# Patient Record
Sex: Female | Born: 1990 | Race: Black or African American | Hispanic: No | Marital: Married | Smoking: Never smoker
Health system: Southern US, Community
[De-identification: ages and names within clinical notes are randomized; demographics above are authoritative.]

## PROBLEM LIST (undated history)

## (undated) DIAGNOSIS — T7840XA Allergy, unspecified, initial encounter: Secondary | ICD-10-CM

## (undated) DIAGNOSIS — J45909 Unspecified asthma, uncomplicated: Secondary | ICD-10-CM

## (undated) DIAGNOSIS — G43909 Migraine, unspecified, not intractable, without status migrainosus: Secondary | ICD-10-CM

## (undated) DIAGNOSIS — B9689 Other specified bacterial agents as the cause of diseases classified elsewhere: Secondary | ICD-10-CM

## (undated) DIAGNOSIS — N200 Calculus of kidney: Secondary | ICD-10-CM

## (undated) DIAGNOSIS — B379 Candidiasis, unspecified: Secondary | ICD-10-CM

## (undated) DIAGNOSIS — Z3490 Encounter for supervision of normal pregnancy, unspecified, unspecified trimester: Secondary | ICD-10-CM

## (undated) DIAGNOSIS — N63 Unspecified lump in unspecified breast: Secondary | ICD-10-CM

## (undated) DIAGNOSIS — N760 Acute vaginitis: Secondary | ICD-10-CM

## (undated) HISTORY — PX: EYE SURGERY: SHX253

## (undated) HISTORY — DX: Calculus of kidney: N20.0

## (undated) HISTORY — DX: Unspecified lump in unspecified breast: N63.0

## (undated) HISTORY — PX: WISDOM TOOTH EXTRACTION: SHX21

## (undated) HISTORY — DX: Allergy, unspecified, initial encounter: T78.40XA

## (undated) HISTORY — DX: Unspecified asthma, uncomplicated: J45.909

## (undated) HISTORY — DX: Candidiasis, unspecified: B37.9

## (undated) HISTORY — DX: Migraine, unspecified, not intractable, without status migrainosus: G43.909

## (undated) HISTORY — DX: Acute vaginitis: N76.0

## (undated) HISTORY — DX: Other specified bacterial agents as the cause of diseases classified elsewhere: B96.89

---

## 2010-02-12 ENCOUNTER — Emergency Department (HOSPITAL_COMMUNITY): Admission: EM | Admit: 2010-02-12 | Discharge: 2010-02-12 | Payer: Self-pay | Admitting: Emergency Medicine

## 2010-06-27 ENCOUNTER — Encounter: Admission: RE | Admit: 2010-06-27 | Discharge: 2010-06-27 | Payer: Self-pay | Admitting: Obstetrics and Gynecology

## 2010-07-31 ENCOUNTER — Emergency Department (HOSPITAL_COMMUNITY)
Admission: EM | Admit: 2010-07-31 | Discharge: 2010-07-31 | Payer: Self-pay | Source: Home / Self Care | Admitting: Emergency Medicine

## 2011-03-23 ENCOUNTER — Emergency Department (HOSPITAL_COMMUNITY): Payer: Medicaid Other

## 2011-03-23 ENCOUNTER — Emergency Department (HOSPITAL_COMMUNITY)
Admission: EM | Admit: 2011-03-23 | Discharge: 2011-03-23 | Disposition: A | Discharge status: Home / Self Care | Payer: Medicaid Other | Attending: Emergency Medicine | Admitting: Emergency Medicine

## 2011-03-23 ENCOUNTER — Encounter (HOSPITAL_COMMUNITY): Payer: Self-pay | Admitting: Radiology

## 2011-03-23 DIAGNOSIS — R059 Cough, unspecified: Secondary | ICD-10-CM | POA: Insufficient documentation

## 2011-03-23 DIAGNOSIS — J3489 Other specified disorders of nose and nasal sinuses: Secondary | ICD-10-CM | POA: Insufficient documentation

## 2011-03-23 DIAGNOSIS — R05 Cough: Secondary | ICD-10-CM | POA: Insufficient documentation

## 2011-03-23 DIAGNOSIS — J189 Pneumonia, unspecified organism: Secondary | ICD-10-CM | POA: Insufficient documentation

## 2011-03-23 LAB — DIFFERENTIAL
Basophils Absolute: 0 10*3/uL (ref 0.0–0.1)
Basophils Relative: 0 % (ref 0–1)
Lymphocytes Relative: 14 % (ref 12–46)
Monocytes Absolute: 0.6 10*3/uL (ref 0.1–1.0)
Monocytes Relative: 6 % (ref 3–12)
Neutro Abs: 9 10*3/uL — ABNORMAL HIGH (ref 1.7–7.7)
Neutrophils Relative %: 79 % — ABNORMAL HIGH (ref 43–77)

## 2011-03-23 LAB — CK TOTAL AND CKMB (NOT AT ARMC)
CK, MB: 1.2 ng/mL (ref 0.3–4.0)
Relative Index: INVALID (ref 0.0–2.5)

## 2011-03-23 LAB — POCT I-STAT, CHEM 8
BUN: 9 mg/dL (ref 6–23)
Creatinine, Ser: 0.7 mg/dL (ref 0.50–1.10)
Glucose, Bld: 91 mg/dL (ref 70–99)
Hemoglobin: 13.9 g/dL (ref 12.0–15.0)
Potassium: 4.3 mEq/L (ref 3.5–5.1)
Sodium: 138 mEq/L (ref 135–145)

## 2011-03-23 LAB — CBC
HCT: 39.4 % (ref 36.0–46.0)
Hemoglobin: 13.5 g/dL (ref 12.0–15.0)
MCHC: 34.3 g/dL (ref 30.0–36.0)
RBC: 4.4 MIL/uL (ref 3.87–5.11)

## 2011-03-23 LAB — TROPONIN I: Troponin I: <0.3 ng/mL (ref <0.30)

## 2011-03-23 MED ORDER — IOHEXOL 300 MG/ML  SOLN
70.0000 mL | Freq: Once | INTRAMUSCULAR | Status: AC | PRN
  Administered 2011-03-23: 70 mL via INTRAVENOUS

## 2011-07-30 ENCOUNTER — Encounter (HOSPITAL_COMMUNITY): Payer: Self-pay | Admitting: *Deleted

## 2011-07-30 ENCOUNTER — Emergency Department (HOSPITAL_COMMUNITY)
Admission: EM | Admit: 2011-07-30 | Discharge: 2011-07-31 | Disposition: A | Discharge status: Home / Self Care | Payer: No Typology Code available for payment source | Attending: Emergency Medicine | Admitting: Emergency Medicine

## 2011-07-30 DIAGNOSIS — M542 Cervicalgia: Secondary | ICD-10-CM | POA: Insufficient documentation

## 2011-07-30 DIAGNOSIS — M546 Pain in thoracic spine: Secondary | ICD-10-CM | POA: Insufficient documentation

## 2011-07-30 DIAGNOSIS — R209 Unspecified disturbances of skin sensation: Secondary | ICD-10-CM | POA: Insufficient documentation

## 2011-07-30 DIAGNOSIS — S161XXA Strain of muscle, fascia and tendon at neck level, initial encounter: Secondary | ICD-10-CM

## 2011-07-30 DIAGNOSIS — S139XXA Sprain of joints and ligaments of unspecified parts of neck, initial encounter: Secondary | ICD-10-CM | POA: Insufficient documentation
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
## 2011-07-31 ENCOUNTER — Emergency Department (HOSPITAL_COMMUNITY): Payer: No Typology Code available for payment source

## 2011-07-31 ENCOUNTER — Encounter (HOSPITAL_COMMUNITY): Payer: Self-pay | Admitting: Emergency Medicine

## 2011-07-31 MED ORDER — NAPROXEN 500 MG PO TABS: 500.0000 mg | ORAL_TABLET | Freq: Two times a day (BID) | ORAL | Status: AC

## 2011-07-31 MED ORDER — CYCLOBENZAPRINE HCL 10 MG PO TABS: 10.0000 mg | ORAL_TABLET | Freq: Two times a day (BID) | ORAL | Status: AC | PRN

## 2011-07-31 NOTE — ED Notes (Signed)
Pt ambulated with a steady gait; VSS; A&Ox3; no acute signs of distress; respirations even and unlabored. No questions at this time.

## 2011-07-31 NOTE — ED Notes (Signed)
Patient is resting comfortably; family at bedside; no complaints.

## 2011-07-31 NOTE — ED Notes (Signed)
Patient transported to X-ray 
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
## 2011-12-20 ENCOUNTER — Encounter: Payer: Self-pay | Admitting: Obstetrics and Gynecology

## 2011-12-26 ENCOUNTER — Encounter: Payer: Self-pay | Admitting: Obstetrics and Gynecology

## 2012-08-07 ENCOUNTER — Encounter: Payer: BC Managed Care – PPO | Admitting: Obstetrics and Gynecology

## 2012-08-22 ENCOUNTER — Encounter: Payer: Self-pay | Admitting: Obstetrics and Gynecology

## 2012-08-22 ENCOUNTER — Ambulatory Visit (INDEPENDENT_AMBULATORY_CARE_PROVIDER_SITE_OTHER): Payer: BC Managed Care – PPO | Admitting: Obstetrics and Gynecology

## 2012-08-22 VITALS — BP 100/64 | Wt 112.0 lb

## 2012-08-22 DIAGNOSIS — N760 Acute vaginitis: Secondary | ICD-10-CM

## 2012-08-22 DIAGNOSIS — B49 Unspecified mycosis: Secondary | ICD-10-CM

## 2012-08-22 DIAGNOSIS — A499 Bacterial infection, unspecified: Secondary | ICD-10-CM

## 2012-08-22 DIAGNOSIS — B9689 Other specified bacterial agents as the cause of diseases classified elsewhere: Secondary | ICD-10-CM

## 2012-08-22 DIAGNOSIS — B379 Candidiasis, unspecified: Secondary | ICD-10-CM | POA: Insufficient documentation

## 2012-08-22 DIAGNOSIS — N898 Other specified noninflammatory disorders of vagina: Secondary | ICD-10-CM

## 2012-08-22 LAB — POCT WET PREP (WET MOUNT)
Clue Cells Wet Prep Whiff POC: NEGATIVE
pH: 4.5

## 2012-08-22 MED ORDER — TERCONAZOLE 0.4 % VA CREA: 1.0000 | TOPICAL_CREAM | Freq: Every day | VAGINAL | Status: DC

## 2012-08-22 MED ORDER — METRONIDAZOLE 500 MG PO TABS: 500.0000 mg | ORAL_TABLET | Freq: Two times a day (BID) | ORAL | Status: DC

## 2012-08-22 NOTE — Progress Notes (Signed)
Color: Pt unsure of color Odor: yes Itching:no Thin:no Thick:yes Fever:no Dyspareunia:no Hx PID:no HX STD:no Pelvic Pain:no Desires Gc/CT:no Desires HIV,RPR,HbsAG:no

## 2012-08-22 NOTE — Progress Notes (Signed)
Complaint of vag odor Abd soft, nt Normal hair distrubition mons pubis,  EGBUS WNL, sterile speculum exam,  vagina pink, moist normal rugae,  cerix LTC with strings of IUD visible A BV VVC P Wet prep +clue, +hyphae neg trich GC/CHL decline RX flagyl, gynezole discussed. Lavera Guise, CNM

## 2013-10-07 ENCOUNTER — Emergency Department (HOSPITAL_COMMUNITY)
Admission: EM | Admit: 2013-10-07 | Discharge: 2013-10-07 | Disposition: A | Discharge status: Home / Self Care | Payer: 59 | Attending: Emergency Medicine | Admitting: Emergency Medicine

## 2013-10-07 ENCOUNTER — Encounter (HOSPITAL_COMMUNITY): Payer: Self-pay | Admitting: Emergency Medicine

## 2013-10-07 DIAGNOSIS — S8990XA Unspecified injury of unspecified lower leg, initial encounter: Secondary | ICD-10-CM | POA: Insufficient documentation

## 2013-10-07 DIAGNOSIS — S4980XA Other specified injuries of shoulder and upper arm, unspecified arm, initial encounter: Secondary | ICD-10-CM | POA: Insufficient documentation

## 2013-10-07 DIAGNOSIS — S99929A Unspecified injury of unspecified foot, initial encounter: Secondary | ICD-10-CM

## 2013-10-07 DIAGNOSIS — S99919A Unspecified injury of unspecified ankle, initial encounter: Secondary | ICD-10-CM

## 2013-10-07 DIAGNOSIS — W009XXA Unspecified fall due to ice and snow, initial encounter: Secondary | ICD-10-CM

## 2013-10-07 DIAGNOSIS — Y929 Unspecified place or not applicable: Secondary | ICD-10-CM | POA: Insufficient documentation

## 2013-10-07 DIAGNOSIS — S79919A Unspecified injury of unspecified hip, initial encounter: Secondary | ICD-10-CM | POA: Insufficient documentation

## 2013-10-07 DIAGNOSIS — Z8619 Personal history of other infectious and parasitic diseases: Secondary | ICD-10-CM | POA: Insufficient documentation

## 2013-10-07 DIAGNOSIS — M25512 Pain in left shoulder: Secondary | ICD-10-CM

## 2013-10-07 DIAGNOSIS — Y9301 Activity, walking, marching and hiking: Secondary | ICD-10-CM | POA: Insufficient documentation

## 2013-10-07 DIAGNOSIS — S46909A Unspecified injury of unspecified muscle, fascia and tendon at shoulder and upper arm level, unspecified arm, initial encounter: Secondary | ICD-10-CM | POA: Insufficient documentation

## 2013-10-07 DIAGNOSIS — S79929A Unspecified injury of unspecified thigh, initial encounter: Secondary | ICD-10-CM

## 2013-10-07 DIAGNOSIS — Z8742 Personal history of other diseases of the female genital tract: Secondary | ICD-10-CM | POA: Insufficient documentation

## 2013-10-07 DIAGNOSIS — M25572 Pain in left ankle and joints of left foot: Secondary | ICD-10-CM

## 2013-10-07 DIAGNOSIS — M25552 Pain in left hip: Secondary | ICD-10-CM

## 2013-10-07 DIAGNOSIS — W108XXA Fall (on) (from) other stairs and steps, initial encounter: Secondary | ICD-10-CM | POA: Insufficient documentation

## 2013-10-07 MED ORDER — IBUPROFEN 800 MG PO TABS: 800.0000 mg | ORAL_TABLET | Freq: Three times a day (TID) | ORAL | Status: DC

## 2013-10-07 NOTE — Discharge Instructions (Signed)
Take the prescribed medication as directed. Keep ankle wrapped for comfort until you feel you can walk without the added support. Return to the ED for new or worsening symptoms.

## 2013-10-07 NOTE — ED Notes (Addendum)
Pt reports falling down 7 steps about an hour ago due to ice being on the steps. Pt denies LOC or hitting her head. Pt reports left shoulder, left hip, left ankle, and left foot pain. Pt is ambulatory to triage without difficulty. Pt is A/O x4, in NAD, and vitals are WDL.

## 2013-10-07 NOTE — ED Provider Notes (Signed)
CSN: [id number]     Arrival date & time 10/07/13  2018 History  This chart was scribed for Sharilyn Sites, non-physician practitioner, working with Juliet Rude. Rubin Payor, MD, by Ellin Mayhew, ED Scribe. This patient was seen in room WTR7/WTR7 and the patient's care was started at 8:45 PM.   Chief Complaint  Patient presents with  . Fall   The history is provided by the patient. No language interpreter was used.   HPI Comments: Pamela Carpenter is a 23 y.o. female who presents to the Emergency Department complaining of an un witnessed fall that occurred earlier today. Patient states she was walking down a flight of seven stairs when she fell due to ice on the surface. She states that she grabbed the railing with her L hand as she fell and overextended her arm.Patient denies hitting her head or any LOC. She presents with constant non changing L ankle pain, L hip pain and L shoulder pain. She rates her L ankle pain as a sharp pain that radiates to her L calf, and states it is more severe than either the shoulder or hip, which she characterizes as "feeling like bruises". Patient is ambulatory. Patient denies any other pain or medical conditions.  No meds taken PTA.  Pt works as a Leisure centre manager i just wanted to make sure i was ok before i went back to work."    Past Medical History  Diagnosis Date  . BV (bacterial vaginosis)   . Breast mass   . Yeast infection    History reviewed. No pertinent past surgical history. No family history on file. History  Substance Use Topics  . Smoking status: Never Smoker   . Smokeless tobacco: Not on file  . Alcohol Use: Yes   OB History   Grav Para Term Preterm Abortions TAB SAB Ect Mult Living   1 1        1      Review of Systems  Constitutional: Negative for fever and chills.  Gastrointestinal: Negative for nausea and vomiting.  Musculoskeletal: Positive for arthralgias (L ankle, shoulder and hip pain). Negative for back pain and neck pain.  Neurological:  Negative for dizziness, weakness, numbness and headaches.  All other systems reviewed and are negative.   Allergies  Review of patient's allergies indicates no known allergies.  Home Medications  No current outpatient prescriptions on file.  Triage Vitals: BP 120/75  Pulse 80  Temp(Src) 98.7 F (37.1 C) (Oral)  Resp 16  SpO2 96%  Physical Exam  Nursing note and vitals reviewed. Constitutional: She is oriented to person, place, and time. She appears well-developed and well-nourished. No distress.  HENT:  Head: Normocephalic and atraumatic.  Mouth/Throat: Oropharynx is clear and moist.  No visible signs of head trauma  Eyes: Conjunctivae and EOM are normal. Pupils are equal, round, and reactive to light.  Neck: Normal range of motion.  Cardiovascular: Normal rate, regular rhythm and normal heart sounds.   Pulmonary/Chest: Effort normal and breath sounds normal. No respiratory distress. She has no wheezes.  Musculoskeletal:       Left shoulder: Normal.       Left hip: Normal.       Left ankle: She exhibits normal range of motion, no swelling, no ecchymosis and no deformity. Tenderness. Lateral malleolus tenderness found. Achilles tendon normal.       Feet:  Left shoulder and left hip WNL Left ankle with mild TTP along lateral malleolus, small abrasion without bruising, swelling, or  gross deformity; full ROM maintained; strong distal pulse and cap refill; sensation intact; moving all toes appropriately  Neurological: She is alert and oriented to person, place, and time.  Skin: Skin is warm and dry. She is not diaphoretic.  Psychiatric: She has a normal mood and affect.    ED Course  Procedures (including critical care time)  DIAGNOSTIC STUDIES: Oxygen Saturation is 96% on room air, adequate by my interpretation.    COORDINATION OF CARE: 8:49 PM-Discussed my low suspicion of any fractures. Will wrap the L ankle and recommended resting the area. Treatment plan discussed with  patient and patient agrees.  Labs Review Labs Reviewed - No data to display Imaging Review No results found.  EKG Interpretation   None      MDM   Final diagnoses:  None   Left shoulder, hip, and left ankle pain without gross deformities.  Pt has been ambulatory without difficulty, minimal pain with weight bearing.  At this time i have low suspicion for acute fxs.  Will wrap left ankle with ace bandage. Pt cleared to return to work.  Advised to take tylenol or motrin as needed for pain.  Discussed plan with pt, they agreed.  Return precautions advised.  I personally performed the services described in this documentation, which was scribed in my presence. The recorded information has been reviewed and is accurate.  Garlon HatchetLisa M Alyrica Thurow, PA-C 10/07/13 2238

## 2013-10-08 NOTE — ED Provider Notes (Signed)
Medical screening examination/treatment/procedure(s) were performed by non-physician practitioner and as supervising physician I was immediately available for consultation/collaboration.  EKG Interpretation   None        Laurin Morgenstern R. Meadow Abramo, MD 10/08/13 0001 

## 2014-06-21 ENCOUNTER — Encounter (HOSPITAL_COMMUNITY): Payer: Self-pay | Admitting: Emergency Medicine

## 2016-08-27 DIAGNOSIS — H5213 Myopia, bilateral: Secondary | ICD-10-CM | POA: Diagnosis not present

## 2017-04-16 DIAGNOSIS — Z113 Encounter for screening for infections with a predominantly sexual mode of transmission: Secondary | ICD-10-CM | POA: Diagnosis not present

## 2017-04-16 DIAGNOSIS — Z6824 Body mass index (BMI) 24.0-24.9, adult: Secondary | ICD-10-CM | POA: Diagnosis not present

## 2017-04-16 DIAGNOSIS — Z01419 Encounter for gynecological examination (general) (routine) without abnormal findings: Secondary | ICD-10-CM | POA: Diagnosis not present

## 2017-04-16 DIAGNOSIS — Z124 Encounter for screening for malignant neoplasm of cervix: Secondary | ICD-10-CM | POA: Diagnosis not present

## 2017-09-11 ENCOUNTER — Ambulatory Visit: Payer: Self-pay | Admitting: Family Medicine

## 2017-10-09 ENCOUNTER — Encounter: Payer: Self-pay | Admitting: Family Medicine

## 2017-10-09 ENCOUNTER — Other Ambulatory Visit: Payer: Self-pay

## 2017-10-09 ENCOUNTER — Ambulatory Visit: Payer: BLUE CROSS/BLUE SHIELD | Admitting: Family Medicine

## 2017-10-09 VITALS — BP 114/74 | HR 86 | Temp 97.9°F | Resp 16 | Ht 60.0 in | Wt 115.2 lb

## 2017-10-09 DIAGNOSIS — Z1329 Encounter for screening for other suspected endocrine disorder: Secondary | ICD-10-CM | POA: Diagnosis not present

## 2017-10-09 DIAGNOSIS — Z130 Encounter for screening for diseases of the blood and blood-forming organs and certain disorders involving the immune mechanism: Secondary | ICD-10-CM

## 2017-10-09 DIAGNOSIS — Z13220 Encounter for screening for lipoid disorders: Secondary | ICD-10-CM | POA: Diagnosis not present

## 2017-10-09 DIAGNOSIS — Z0000 Encounter for general adult medical examination without abnormal findings: Secondary | ICD-10-CM | POA: Diagnosis not present

## 2017-10-09 NOTE — Patient Instructions (Addendum)
   IF you received an x-ray today, you will receive an invoice from [facility]. Please contact [facility] at [phone] with questions or concerns regarding your invoice.   IF you received labwork today, you will receive an invoice from [facility]. Please contact [facility] at [phone] with questions or concerns regarding your invoice.   Our billing staff will not be able to assist you with questions regarding bills from these companies.  You will be contacted with the lab results as soon as they are available. The fastest way to get your results is to activate your My Chart account. Instructions are located on the last page of this paperwork. If you have not heard from us regarding the results in 2 weeks, please contact this office.    Health Maintenance, Female Adopting a healthy lifestyle and getting preventive care can go a long way to promote health and wellness. Talk with your health care provider about what schedule of regular examinations is right for you. This is a good chance for you to check in with your provider about disease prevention and staying healthy. In between checkups, there are plenty of things you can do on your own. Experts have done a lot of research about which lifestyle changes and preventive measures are most likely to keep you healthy. Ask your health care provider for more information. Weight and diet Eat a healthy diet  Be sure to include plenty of vegetables, fruits, low-fat dairy products, and lean protein.  Do not eat a lot of foods high in solid fats, added sugars, or salt.  Get regular exercise. This is one of the most important things you can do for your health. ? Most adults should exercise for at least 150 minutes each week. The exercise should increase your heart rate and make you sweat (moderate-intensity exercise). ? Most adults should also do strengthening exercises at least twice a week. This is in addition to the  moderate-intensity exercise.  Maintain a healthy weight  Body mass index (BMI) is a measurement that can be used to identify possible weight problems. It estimates body fat based on height and weight. Your health care provider can help determine your BMI and help you achieve or maintain a healthy weight.  For females 20 years of age and older: ? A BMI below 18.5 is considered underweight. ? A BMI of 18.5 to 24.9 is normal. ? A BMI of 25 to 29.9 is considered overweight. ? A BMI of 30 and above is considered obese.  Watch levels of cholesterol and blood lipids  You should start having your blood tested for lipids and cholesterol at 29 years of age, then have this test every 5 years.  You may need to have your cholesterol levels checked more often if: ? Your lipid or cholesterol levels are high. ? You are older than 29 years of age. ? You are at high risk for heart disease.  Cancer screening Lung Cancer  Lung cancer screening is recommended for adults 55-80 years old who are at high risk for lung cancer because of a history of smoking.  A yearly low-dose CT scan of the lungs is recommended for people who: ? Currently smoke. ? Have quit within the past 15 years. ? Have at least a 30-pack-year history of smoking. A pack year is smoking an average of one pack of cigarettes a day for 1 year.  Yearly screening should continue until it has been 15 years since you quit.  Yearly screening   should stop if you develop a health problem that would prevent you from having lung cancer treatment.  Breast Cancer  Practice breast self-awareness. This means understanding how your breasts normally appear and feel.  It also means doing regular breast self-exams. Let your health care provider know about any changes, no matter how small.  If you are in your 20s or 30s, you should have a clinical breast exam (CBE) by a health care provider every 1-3 years as part of a regular health exam.  If you  are 42 or older, have a CBE every year. Also consider having a breast X-ray (mammogram) every year.  If you have a family history of breast cancer, talk to your health care provider about genetic screening.  If you are at high risk for breast cancer, talk to your health care provider about having an MRI and a mammogram every year.  Breast cancer gene (BRCA) assessment is recommended for women who have family members with BRCA-related cancers. BRCA-related cancers include: ? Breast. ? Ovarian. ? Tubal. ? Peritoneal cancers.  Results of the assessment will determine the need for genetic counseling and BRCA1 and BRCA2 testing.  Cervical Cancer Your health care provider may recommend that you be screened regularly for cancer of the pelvic organs (ovaries, uterus, and vagina). This screening involves a pelvic examination, including checking for microscopic changes to the surface of your cervix (Pap test). You may be encouraged to have this screening done every 3 years, beginning at age 56.  For women ages 41-65, health care providers may recommend pelvic exams and Pap testing every 3 years, or they may recommend the Pap and pelvic exam, combined with testing for human papilloma virus (HPV), every 5 years. Some types of HPV increase your risk of cervical cancer. Testing for HPV may also be done on women of any age with unclear Pap test results.  Other health care providers may not recommend any screening for nonpregnant women who are considered low risk for pelvic cancer and who do not have symptoms. Ask your health care provider if a screening pelvic exam is right for you.  If you have had past treatment for cervical cancer or a condition that could lead to cancer, you need Pap tests and screening for cancer for at least 20 years after your treatment. If Pap tests have been discontinued, your risk factors (such as having a new sexual partner) need to be reassessed to determine if screening should  resume. Some women have medical problems that increase the chance of getting cervical cancer. In these cases, your health care provider may recommend more frequent screening and Pap tests.  Colorectal Cancer  This type of cancer can be detected and often prevented.  Routine colorectal cancer screening usually begins at 29 years of age and continues through 29 years of age.  Your health care provider may recommend screening at an earlier age if you have risk factors for colon cancer.  Your health care provider may also recommend using home test kits to check for hidden blood in the stool.  A small camera at the end of a tube can be used to examine your colon directly (sigmoidoscopy or colonoscopy). This is done to check for the earliest forms of colorectal cancer.  Routine screening usually begins at age 16.  Direct examination of the colon should be repeated every 5-10 years through 29 years of age. However, you may need to be screened more often if early forms of precancerous polyps or  small growths are found.  Skin Cancer  Check your skin from head to toe regularly.  Tell your health care provider about any new moles or changes in moles, especially if there is a change in a mole's shape or color.  Also tell your health care provider if you have a mole that is larger than the size of a pencil eraser.  Always use sunscreen. Apply sunscreen liberally and repeatedly throughout the day.  Protect yourself by wearing long sleeves, pants, a wide-brimmed hat, and sunglasses whenever you are outside.  Heart disease, diabetes, and high blood pressure  High blood pressure causes heart disease and increases the risk of stroke. High blood pressure is more likely to develop in: ? People who have blood pressure in the high end of the normal range (130-139/85-89 mm Hg). ? People who are overweight or obese. ? People who are African American.  If you are 61-36 years of age, have your blood  pressure checked every 3-5 years. If you are 6 years of age or older, have your blood pressure checked every year. You should have your blood pressure measured twice-once when you are at a hospital or clinic, and once when you are not at a hospital or clinic. Record the average of the two measurements. To check your blood pressure when you are not at a hospital or clinic, you can use: ? An automated blood pressure machine at a pharmacy. ? A home blood pressure monitor.  If you are between 7 years and 29 years old, ask your health care provider if you should take aspirin to prevent strokes.  Have regular diabetes screenings. This involves taking a blood sample to check your fasting blood sugar level. ? If you are at a normal weight and have a low risk for diabetes, have this test once every three years after 29 years of age. ? If you are overweight and have a high risk for diabetes, consider being tested at a younger age or more often. Preventing infection Hepatitis B  If you have a higher risk for hepatitis B, you should be screened for this virus. You are considered at high risk for hepatitis B if: ? You were born in a country where hepatitis B is common. Ask your health care provider which countries are considered high risk. ? Your parents were born in a high-risk country, and you have not been immunized against hepatitis B (hepatitis B vaccine). ? You have HIV or AIDS. ? You use needles to inject street drugs. ? You live with someone who has hepatitis B. ? You have had sex with someone who has hepatitis B. ? You get hemodialysis treatment. ? You take certain medicines for conditions, including cancer, organ transplantation, and autoimmune conditions.  Hepatitis C  Blood testing is recommended for: ? Everyone born from [birth year] through 1967. ? Anyone with known risk factors for hepatitis C.  Sexually transmitted infections (STIs)  You should be screened for sexually transmitted  infections (STIs) including gonorrhea and chlamydia if: ? You are sexually active and are younger than 29 years of age. ? You are older than 29 years of age and your health care provider tells you that you are at risk for this type of infection. ? Your sexual activity has changed since you were last screened and you are at an increased risk for chlamydia or gonorrhea. Ask your health care provider if you are at risk.  If you do not have HIV, but are at risk, it  may be recommended that you take a prescription medicine daily to prevent HIV infection. This is called pre-exposure prophylaxis (PrEP). You are considered at risk if: ? You are sexually active and do not regularly use condoms or know the HIV status of your partner(s). ? You take drugs by injection. ? You are sexually active with a partner who has HIV.  Talk with your health care provider about whether you are at high risk of being infected with HIV. If you choose to begin PrEP, you should first be tested for HIV. You should then be tested every 3 months for as long as you are taking PrEP. Pregnancy  If you are premenopausal and you may become pregnant, ask your health care provider about preconception counseling.  If you may become pregnant, take 400 to 800 micrograms (mcg) of folic acid every day.  If you want to prevent pregnancy, talk to your health care provider about birth control (contraception). Osteoporosis and menopause  Osteoporosis is a disease in which the bones lose minerals and strength with aging. This can result in serious bone fractures. Your risk for osteoporosis can be identified using a bone density scan.  If you are 65 years of age or older, or if you are at risk for osteoporosis and fractures, ask your health care provider if you should be screened.  Ask your health care provider whether you should take a calcium or vitamin D supplement to lower your risk for osteoporosis.  Menopause may have certain physical  symptoms and risks.  Hormone replacement therapy may reduce some of these symptoms and risks. Talk to your health care provider about whether hormone replacement therapy is right for you. Follow these instructions at home:  Schedule regular health, dental, and eye exams.  Stay current with your immunizations.  Do not use any tobacco products including cigarettes, chewing tobacco, or electronic cigarettes.  If you are pregnant, do not drink alcohol.  If you are breastfeeding, limit how much and how often you drink alcohol.  Limit alcohol intake to no more than 1 drink per day for nonpregnant women. One drink equals 12 ounces of beer, 5 ounces of wine, or 1 ounces of hard liquor.  Do not use street drugs.  Do not share needles.  Ask your health care provider for help if you need support or information about quitting drugs.  Tell your health care provider if you often feel depressed.  Tell your health care provider if you have ever been abused or do not feel safe at home. This information is not intended to replace advice given to you by your health care provider. Make sure you discuss any questions you have with your health care provider. Document Released: 02/19/2011 Document Revised: 01/12/2016 Document Reviewed: 05/10/2015 [employer] Interactive Patient Education  2018 [employer]  

## 2017-10-09 NOTE — Progress Notes (Signed)
Chief Complaint  Patient presents with  . Annual Exam    cpe    Subjective:  Pamela Carpenter is a 27 y.o. female here for a health maintenance visit.  Patient is new pt   Patient Active Problem List   Diagnosis Date Noted  . Yeast infection 08/22/2012  . BV (bacterial vaginosis) 08/22/2012    Past Medical History:  Diagnosis Date  . Asthma    exercised induced  . Breast mass   . BV (bacterial vaginosis)   . Kidney stones   . Yeast infection     Past Surgical History:  Procedure Laterality Date  . WISDOM TOOTH EXTRACTION       Outpatient Medications Prior to Visit  Medication Sig Dispense Refill  . ibuprofen (ADVIL,MOTRIN) 800 MG tablet Take 1 tablet (800 mg total) by mouth 3 (three) times daily. 21 tablet 0   No facility-administered medications prior to visit.     Allergies  Allergen Reactions  . Amoxicillin Hives     Family History  Problem Relation Age of Onset  . Hypertension Father   . Diabetes Maternal Grandmother   . Diabetes Maternal Grandfather   . Heart disease Maternal Grandfather      Health Habits: Dental Exam: up to date Eye Exam: up to date Exercise: 0 times/week on average Current exercise activities: walking/running Diet: balanced   Social History   Socioeconomic History  . Marital status: Married    Spouse name: Not on file  . Number of children: Not on file  . Years of education: Not on file  . Highest education level: Not on file  Social Needs  . Financial resource strain: Not on file  . Food insecurity - worry: Not on file  . Food insecurity - inability: Not on file  . Transportation needs - medical: Not on file  . Transportation needs - non-medical: Not on file  Occupational History  . Not on file  Tobacco Use  . Smoking status: Never Smoker  . Smokeless tobacco: Never Used  Substance and Sexual Activity  . Alcohol use: Yes  . Drug use: No  . Sexual activity: Yes    Birth control/protection: IUD  Other Topics  Concern  . Not on file  Social History Narrative  . Not on file   Social History   Substance and Sexual Activity  Alcohol Use Yes   Social History   Tobacco Use  Smoking Status Never Smoker  Smokeless Tobacco Never Used   Social History   Substance and Sexual Activity  Drug Use No    GYN: Sexual Health Menstrual status: regular menses LMP: Patient's last menstrual period was 05/18/2017. Last pap smear: see HM section History of abnormal pap smears:  Sexually active: with female partner Current contraception: IUD   Health Maintenance: See under health Maintenance activity for review of completion dates as well.  There is no immunization history on file for this patient.    Depression Screen-PHQ2/9 Depression screen [facility] 2/9 10/09/2017  Decreased Interest 0  Down, Depressed, Hopeless 0  PHQ - 2 Score 0     Depression Severity and Treatment Recommendations:  0-4= None  5-9= Mild / Treatment: Support, educate to call if worse; return in one month  10-14= Moderate / Treatment: Support, watchful waiting; Antidepressant or Psycotherapy  15-19= Moderately severe / Treatment: Antidepressant OR Psychotherapy  >= 20 = Major depression, severe / Antidepressant AND Psychotherapy    Review of Systems   Review of Systems  Constitutional: Negative  for chills, fever and weight loss.  Eyes: Negative for blurred vision and double vision.  Respiratory: Negative for cough, shortness of breath and wheezing.   Cardiovascular: Negative for chest pain and palpitations.  Gastrointestinal: Negative for abdominal pain, nausea and vomiting.  Genitourinary: Negative for dysuria, frequency and urgency.  Skin: Negative for itching and rash.  Neurological: Negative for dizziness, tingling and headaches.  Psychiatric/Behavioral: Negative for depression. The patient is not nervous/anxious and does not have insomnia.     See HPI for ROS as well.    Objective:   Vitals:   10/09/17  1531  BP: 114/74  Pulse: 86  Resp: 16  Temp: 97.9 F (36.6 C)  TempSrc: Oral  SpO2: 100%  Weight: 115 lb 3.2 oz (52.3 kg)  Height: 5' (1.524 m)   Body mass index is 22.5 kg/m.  Physical Exam  BP 114/74 (BP Location: Left Arm, Patient Position: Sitting, Cuff Size: Normal)   Pulse 86   Temp 97.9 F (36.6 C) (Oral)   Resp 16   Ht 5' (1.524 m)   Wt 115 lb 3.2 oz (52.3 kg)   LMP 05/18/2017   SpO2 100%   BMI 22.50 kg/m   General Appearance:    Alert, cooperative, no distress, appears stated age  Head:    Normocephalic, without obvious abnormality, atraumatic  Eyes:    PERRL, conjunctiva/corneas clear, EOM's intact, fundi    benign, both eyes  Ears:    Normal TM's and external ear canals, both ears  Nose:   Nares normal, septum midline, mucosa normal, no drainage    or sinus tenderness  Throat:   Lips, mucosa, and tongue normal; teeth and gums normal  Neck:   Supple, symmetrical, trachea midline, no adenopathy;    thyroid:  no enlargement/tenderness/nodules; no carotid   bruit or JVD  Back:     Symmetric, no curvature, ROM normal, no CVA tenderness  Lungs:     Clear to auscultation bilaterally, respirations unlabored  Chest Wall:    No tenderness or deformity   Heart:    Regular rate and rhythm, S1 and S2 normal, no murmur, rub   or gallop  Breast Exam:    Done by Gynecology  Abdomen:     Soft, non-tender, bowel sounds active all four quadrants,    no masses, no organomegaly  Genitalia:    Done by Gynecology  Rectal:    Normal tone, normal prostate, no masses or tenderness;   guaiac negative stool  Extremities:   Extremities normal, atraumatic, no cyanosis or edema  Pulses:   2+ and symmetric all extremities  Skin:   Skin color, texture, turgor normal, no rashes or lesions  Lymph nodes:   Cervical, supraclavicular, and axillary nodes normal  Neurologic:   CNII-XII intact, normal strength, sensation and reflexes    throughout      Assessment/Plan:   Patient was  seen for a health maintenance exam.  Counseled the patient on health maintenance issues. Reviewed her health mainteance schedule and ordered appropriate tests (see orders.) Counseled on regular exercise and weight management. Recommend regular eye exams and dental cleaning.   The following issues were addressed today for health maintenance:   Maloni was seen today for annual exam.  Diagnoses and all orders for this visit:  Encounter for health maintenance examination in adult- pap smear and breast exam done by Gynecology Discussed that she should continue exercise and dental exam   Screening, lipid -     Lipid panel -  Comprehensive metabolic panel  Screening, anemia, deficiency, iron -     CBC  Screening for thyroid disorder -     TSH  Other orders -     Cancel: Pap IG, CT/NG NAA, and HPV (high risk) [facility] -     Cancel: Pap IG, CT/NG w/ reflex HPV when ASC-U    Return if symptoms worsen or fail to improve.    Body mass index is 22.5 kg/m.:  Discussed the patient's BMI with patient. The BMI body mass index is 22.5 kg/m.     No future appointments.  Patient Instructions       IF you received an x-ray today, you will receive an invoice from [facility]. Please contact [facility] at [phone] with questions or concerns regarding your invoice.   IF you received labwork today, you will receive an invoice from [place]. Please contact [facility] at [phone] with questions or concerns regarding your invoice.   Our billing staff will not be able to assist you with questions regarding bills from these companies.  You will be contacted with the lab results as soon as they are available. The fastest way to get your results is to activate your My Chart account. Instructions are located on the last page of this paperwork. If you have not heard from [place] regarding the results in 2 weeks, please contact this office.    Health Maintenance,  Female Adopting a healthy lifestyle and getting preventive care can go a long way to promote health and wellness. Talk with your health care provider about what schedule of regular examinations is right for you. This is a good chance for you to check in with your provider about disease prevention and staying healthy. In between checkups, there are plenty of things you can do on your own. Experts have done a lot of research about which lifestyle changes and preventive measures are most likely to keep you healthy. Ask your health care provider for more information. Weight and diet Eat a healthy diet  Be sure to include plenty of vegetables, fruits, low-fat dairy products, and lean protein.  Do not eat a lot of foods high in solid fats, added sugars, or salt.  Get regular exercise. This is one of the most important things you can do for your health. ? Most adults should exercise for at least 150 minutes each week. The exercise should increase your heart rate and make you sweat (moderate-intensity exercise). ? Most adults should also do strengthening exercises at least twice a week. This is in addition to the moderate-intensity exercise.  Maintain a healthy weight  Body mass index (BMI) is a measurement that can be used to identify possible weight problems. It estimates body fat based on height and weight. Your health care provider can help determine your BMI and help you achieve or maintain a healthy weight.  For females 58 years of age and older: ? A BMI below 18.5 is considered underweight. ? A BMI of 18.5 to 24.9 is normal. ? A BMI of 25 to 29.9 is considered overweight. ? A BMI of 30 and above is considered obese.  Watch levels of cholesterol and blood lipids  You should start having your blood tested for lipids and cholesterol at 29 years of age, then have this test every 5 years.  You may need to have your cholesterol levels checked more often if: ? Your lipid or cholesterol levels  are high. ? You are older than 29 years of age. ?  You are at high risk for heart disease.  Cancer screening Lung Cancer  Lung cancer screening is recommended for adults 86-46 years old who are at high risk for lung cancer because of a history of smoking.  A yearly low-dose CT scan of the lungs is recommended for people who: ? Currently smoke. ? Have quit within the past 15 years. ? Have at least a 30-pack-year history of smoking. A pack year is smoking an average of one pack of cigarettes a day for 1 year.  Yearly screening should continue until it has been 15 years since you quit.  Yearly screening should stop if you develop a health problem that would prevent you from having lung cancer treatment.  Breast Cancer  Practice breast self-awareness. This means understanding how your breasts normally appear and feel.  It also means doing regular breast self-exams. Let your health care provider know about any changes, no matter how small.  If you are in your 20s or 30s, you should have a clinical breast exam (CBE) by a health care provider every 1-3 years as part of a regular health exam.  If you are 73 or older, have a CBE every year. Also consider having a breast X-ray (mammogram) every year.  If you have a family history of breast cancer, talk to your health care provider about genetic screening.  If you are at high risk for breast cancer, talk to your health care provider about having an MRI and a mammogram every year.  Breast cancer gene (BRCA) assessment is recommended for women who have family members with BRCA-related cancers. BRCA-related cancers include: ? Breast. ? Ovarian. ? Tubal. ? Peritoneal cancers.  Results of the assessment will determine the need for genetic counseling and BRCA1 and BRCA2 testing.  Cervical Cancer Your health care provider may recommend that you be screened regularly for cancer of the pelvic organs (ovaries, uterus, and vagina). This screening  involves a pelvic examination, including checking for microscopic changes to the surface of your cervix (Pap test). You may be encouraged to have this screening done every 3 years, beginning at age 23.  For women ages 29-65, health care providers may recommend pelvic exams and Pap testing every 3 years, or they may recommend the Pap and pelvic exam, combined with testing for human papilloma virus (HPV), every 5 years. Some types of HPV increase your risk of cervical cancer. Testing for HPV may also be done on women of any age with unclear Pap test results.  Other health care providers may not recommend any screening for nonpregnant women who are considered low risk for pelvic cancer and who do not have symptoms. Ask your health care provider if a screening pelvic exam is right for you.  If you have had past treatment for cervical cancer or a condition that could lead to cancer, you need Pap tests and screening for cancer for at least 20 years after your treatment. If Pap tests have been discontinued, your risk factors (such as having a new sexual partner) need to be reassessed to determine if screening should resume. Some women have medical problems that increase the chance of getting cervical cancer. In these cases, your health care provider may recommend more frequent screening and Pap tests.  Colorectal Cancer  This type of cancer can be detected and often prevented.  Routine colorectal cancer screening usually begins at 29 years of age and continues through 29 years of age.  Your health care provider may recommend screening at  an earlier age if you have risk factors for colon cancer.  Your health care provider may also recommend using home test kits to check for hidden blood in the stool.  A small camera at the end of a tube can be used to examine your colon directly (sigmoidoscopy or colonoscopy). This is done to check for the earliest forms of colorectal cancer.  Routine screening usually  begins at age 45.  Direct examination of the colon should be repeated every 5-10 years through 29 years of age. However, you may need to be screened more often if early forms of precancerous polyps or small growths are found.  Skin Cancer  Check your skin from head to toe regularly.  Tell your health care provider about any new moles or changes in moles, especially if there is a change in a mole's shape or color.  Also tell your health care provider if you have a mole that is larger than the size of a pencil eraser.  Always use sunscreen. Apply sunscreen liberally and repeatedly throughout the day.  Protect yourself by wearing long sleeves, pants, a wide-brimmed hat, and sunglasses whenever you are outside.  Heart disease, diabetes, and high blood pressure  High blood pressure causes heart disease and increases the risk of stroke. High blood pressure is more likely to develop in: ? People who have blood pressure in the high end of the normal range (130-139/85-89 mm Hg). ? People who are overweight or obese. ? People who are African American.  If you are 45-3 years of age, have your blood pressure checked every 3-5 years. If you are 73 years of age or older, have your blood pressure checked every year. You should have your blood pressure measured twice-once when you are at a hospital or clinic, and once when you are not at a hospital or clinic. Record the average of the two measurements. To check your blood pressure when you are not at a hospital or clinic, you can use: ? An automated blood pressure machine at a pharmacy. ? A home blood pressure monitor.  If you are between 20 years and 35 years old, ask your health care provider if you should take aspirin to prevent strokes.  Have regular diabetes screenings. This involves taking a blood sample to check your fasting blood sugar level. ? If you are at a normal weight and have a low risk for diabetes, have this test once every three  years after 29 years of age. ? If you are overweight and have a high risk for diabetes, consider being tested at a younger age or more often. Preventing infection Hepatitis B  If you have a higher risk for hepatitis B, you should be screened for this virus. You are considered at high risk for hepatitis B if: ? You were born in a country where hepatitis B is common. Ask your health care provider which countries are considered high risk. ? Your parents were born in a high-risk country, and you have not been immunized against hepatitis B (hepatitis B vaccine). ? You have HIV or AIDS. ? You use needles to inject street drugs. ? You live with someone who has hepatitis B. ? You have had sex with someone who has hepatitis B. ? You get hemodialysis treatment. ? You take certain medicines for conditions, including cancer, organ transplantation, and autoimmune conditions.  Hepatitis C  Blood testing is recommended for: ? Everyone born from [birth year] through 1967. ? Anyone with known risk factors for  hepatitis C.  Sexually transmitted infections (STIs)  You should be screened for sexually transmitted infections (STIs) including gonorrhea and chlamydia if: ? You are sexually active and are younger than 29 years of age. ? You are older than 29 years of age and your health care provider tells you that you are at risk for this type of infection. ? Your sexual activity has changed since you were last screened and you are at an increased risk for chlamydia or gonorrhea. Ask your health care provider if you are at risk.  If you do not have HIV, but are at risk, it may be recommended that you take a prescription medicine daily to prevent HIV infection. This is called pre-exposure prophylaxis (PrEP). You are considered at risk if: ? You are sexually active and do not regularly use condoms or know the HIV status of your partner(s). ? You take drugs by injection. ? You are sexually active with a partner who has  HIV.  Talk with your health care provider about whether you are at high risk of being infected with HIV. If you choose to begin PrEP, you should first be tested for HIV. You should then be tested every 3 months for as long as you are taking PrEP. Pregnancy  If you are premenopausal and you may become pregnant, ask your health care provider about preconception counseling.  If you may become pregnant, take 400 to 800 micrograms (mcg) of folic acid every day.  If you want to prevent pregnancy, talk to your health care provider about birth control (contraception). Osteoporosis and menopause  Osteoporosis is a disease in which the bones lose minerals and strength with aging. This can result in serious bone fractures. Your risk for osteoporosis can be identified using a bone density scan.  If you are 52 years of age or older, or if you are at risk for osteoporosis and fractures, ask your health care provider if you should be screened.  Ask your health care provider whether you should take a calcium or vitamin D supplement to lower your risk for osteoporosis.  Menopause may have certain physical symptoms and risks.  Hormone replacement therapy may reduce some of these symptoms and risks. Talk to your health care provider about whether hormone replacement therapy is right for you. Follow these instructions at home:  Schedule regular health, dental, and eye exams.  Stay current with your immunizations.  Do not use any tobacco products including cigarettes, chewing tobacco, or electronic cigarettes.  If you are pregnant, do not drink alcohol.  If you are breastfeeding, limit how much and how often you drink alcohol.  Limit alcohol intake to no more than 1 drink per day for nonpregnant women. One drink equals 12 ounces of beer, 5 ounces of wine, or 1 ounces of hard liquor.  Do not use street drugs.  Do not share needles.  Ask your health care provider for help if you need support or  information about quitting drugs.  Tell your health care provider if you often feel depressed.  Tell your health care provider if you have ever been abused or do not feel safe at home. This information is not intended to replace advice given to you by your health care provider. Make sure you discuss any questions you have with your health care provider. Document Released: 02/19/2011 Document Revised: 01/12/2016 Document Reviewed: 05/10/2015 Elsevier Interactive Patient Education  [employer].

## 2017-10-10 ENCOUNTER — Encounter: Payer: Self-pay | Admitting: Family Medicine

## 2017-10-10 LAB — CBC
HEMOGLOBIN: 14.1 g/dL (ref 11.1–15.9)
Hematocrit: 42.5 % (ref 34.0–46.6)
MCH: 30.8 pg (ref 26.6–33.0)
MCHC: 33.2 g/dL (ref 31.5–35.7)
MCV: 93 fL (ref 79–97)
Platelets: 285 10*3/uL (ref 150–379)
RBC: 4.58 x10E6/uL (ref 3.77–5.28)
RDW: 13 % (ref 12.3–15.4)
WBC: 5.7 10*3/uL (ref 3.4–10.8)

## 2017-10-10 LAB — COMPREHENSIVE METABOLIC PANEL
A/G RATIO: 1.8 (ref 1.2–2.2)
ALT: 8 IU/L (ref 0–32)
AST: 10 IU/L (ref 0–40)
Albumin: 4.9 g/dL (ref 3.5–5.5)
Alkaline Phosphatase: 39 IU/L (ref 39–117)
BUN/Creatinine Ratio: 16 (ref 9–23)
BUN: 12 mg/dL (ref 6–20)
Bilirubin Total: 0.9 mg/dL (ref 0.0–1.2)
CO2: 21 mmol/L (ref 20–29)
Calcium: 9.8 mg/dL (ref 8.7–10.2)
Chloride: 103 mmol/L (ref 96–106)
Creatinine, Ser: 0.77 mg/dL (ref 0.57–1.00)
GFR, EST AFRICAN AMERICAN: 122 mL/min/{1.73_m2} (ref >59)
GFR, EST NON AFRICAN AMERICAN: 105 mL/min/{1.73_m2} (ref >59)
GLUCOSE: 88 mg/dL (ref 65–99)
Globulin, Total: 2.7 g/dL (ref 1.5–4.5)
Potassium: 4.4 mmol/L (ref 3.5–5.2)
Sodium: 141 mmol/L (ref 134–144)
TOTAL PROTEIN: 7.6 g/dL (ref 6.0–8.5)

## 2017-10-10 LAB — LIPID PANEL
Chol/HDL Ratio: 2.8 ratio (ref 0.0–4.4)
Cholesterol, Total: 170 mg/dL (ref 100–199)
HDL: 60 mg/dL (ref >39)
LDL Calculated: 98 mg/dL (ref 0–99)
Triglycerides: 61 mg/dL (ref 0–149)
VLDL Cholesterol Cal: 12 mg/dL (ref 5–40)

## 2017-10-10 LAB — TSH: TSH: 1.52 u[IU]/mL (ref 0.450–4.500)

## 2017-11-01 ENCOUNTER — Encounter: Payer: Self-pay | Admitting: Family Medicine

## 2017-11-06 ENCOUNTER — Encounter: Payer: Self-pay | Admitting: Family Medicine

## 2017-11-07 ENCOUNTER — Ambulatory Visit (INDEPENDENT_AMBULATORY_CARE_PROVIDER_SITE_OTHER): Payer: BLUE CROSS/BLUE SHIELD | Admitting: Physician Assistant

## 2017-11-07 DIAGNOSIS — Z111 Encounter for screening for respiratory tuberculosis: Secondary | ICD-10-CM

## 2017-11-07 NOTE — Progress Notes (Signed)
Lab only visit. Needed TB gold drawn for school.

## 2017-11-11 LAB — QUANTIFERON-TB GOLD PLUS
QUANTIFERON NIL VALUE: 0.02 [IU]/mL
QUANTIFERON-TB GOLD PLUS: NEGATIVE
QuantiFERON Mitogen Value: >10 IU/mL
QuantiFERON TB1 Ag Value: 0.02 IU/mL
QuantiFERON TB2 Ag Value: 0.02 IU/mL

## 2017-11-15 NOTE — Progress Notes (Signed)
Patient was not seen by me.

## 2017-11-27 ENCOUNTER — Encounter: Payer: Self-pay | Admitting: Physician Assistant

## 2017-12-31 ENCOUNTER — Other Ambulatory Visit: Payer: Self-pay | Admitting: Urgent Care

## 2017-12-31 MED ORDER — ALPRAZOLAM 0.25 MG PO TABS: 0.2500 mg | ORAL_TABLET | Freq: Every evening | ORAL | 0 refills | Status: DC | PRN

## 2017-12-31 NOTE — Progress Notes (Signed)
Telephone encounter performed. Patient is undergoing severe anxiety due to stressors of her work and education. Provided with script for Xanax to bridge appointment for anxiety.

## 2018-03-25 ENCOUNTER — Ambulatory Visit: Payer: BLUE CROSS/BLUE SHIELD | Admitting: Urgent Care

## 2018-03-25 ENCOUNTER — Encounter: Payer: Self-pay | Admitting: Urgent Care

## 2018-03-25 VITALS — BP 132/75 | HR 96 | Temp 98.5°F | Resp 17 | Ht 60.0 in | Wt 118.0 lb

## 2018-03-25 DIAGNOSIS — L299 Pruritus, unspecified: Secondary | ICD-10-CM | POA: Diagnosis not present

## 2018-03-25 DIAGNOSIS — L237 Allergic contact dermatitis due to plants, except food: Secondary | ICD-10-CM

## 2018-03-25 MED ORDER — TRIAMCINOLONE ACETONIDE 0.1 % EX CREA: 1.0000 "application " | TOPICAL_CREAM | Freq: Two times a day (BID) | CUTANEOUS | 0 refills | Status: DC

## 2018-03-25 MED ORDER — TRIAMCINOLONE ACETONIDE 0.1 % EX CREA: 1.0000 "application " | TOPICAL_CREAM | Freq: Two times a day (BID) | CUTANEOUS | 1 refills | Status: DC

## 2018-03-25 MED ORDER — TRIAMCINOLONE ACETONIDE 0.1 % EX CREA: TOPICAL_CREAM | Freq: Two times a day (BID) | CUTANEOUS | Status: DC

## 2018-03-25 NOTE — Patient Instructions (Addendum)
Use the cream twice a daily on affected areas.  Only use a small amount. Use Zyrtec once a day and Zantac twice a day for added relief of itching and inflammation  Contact Dermatitis Dermatitis is redness, soreness, and swelling (inflammation) of the skin. Contact dermatitis is a reaction to certain substances that touch the skin. You either touched something that irritated your skin, or you have allergies to something you touched. Follow these instructions at home: Skin Care  Moisturize your skin as needed.  Apply cool compresses to the affected areas.  Try taking a bath with: ? Epsom salts. Follow the instructions on the package. You can get these at a pharmacy or grocery store. ? Baking soda. Pour a small amount into the bath as told by your doctor. ? Colloidal oatmeal. Follow the instructions on the package. You can get this at a pharmacy or grocery store.  Try applying baking soda paste to your skin. Stir water into baking soda until it looks like paste.  Do not scratch your skin.  Bathe less often.  Bathe in lukewarm water. Avoid using hot water. Medicines  Take or apply over-the-counter and prescription medicines only as told by your doctor.  If you were prescribed an antibiotic medicine, take or apply your antibiotic as told by your doctor. Do not stop taking the antibiotic even if your condition starts to get better. General instructions  Keep all follow-up visits as told by your doctor. This is important.  Avoid the substance that caused your reaction. If you do not know what caused it, keep a journal to try to track what caused it. Write down: ? What you eat. ? What cosmetic products you use. ? What you drink. ? What you wear in the affected area. This includes jewelry.  If you were given a bandage (dressing), take care of it as told by your doctor. This includes when to change and remove it. Contact a doctor if:  You do not get better with treatment.  Your  condition gets worse.  You have signs of infection such as: ? Swelling. ? Tenderness. ? Redness. ? Soreness. ? Warmth.  You have a fever.  You have new symptoms. Get help right away if:  You have a very bad headache.  You have neck pain.  Your neck is stiff.  You throw up (vomit).  You feel very sleepy.  You see red streaks coming from the affected area.  Your bone or joint underneath the affected area becomes painful after the skin has healed.  The affected area turns darker.  You have trouble breathing. This information is not intended to replace advice given to you by your health care provider. Make sure you discuss any questions you have with your health care provider. Document Released: 06/03/2009 Document Revised: 01/12/2016 Document Reviewed: 12/22/2014 Elsevier Interactive Patient Education  2018 [employer]     IF you received an x-ray today, you will receive an invoice from [facility]. Please contact [facility] at [phone] with questions or concerns regarding your invoice.   IF you received labwork today, you will receive an invoice from [facility]. Please contact [facility] at [phone] with questions or concerns regarding your invoice.   Our billing staff will not be able to assist you with questions regarding bills from these companies.  You will be contacted with the lab results as soon as they are available. The fastest way to get your results is to activate your My Chart account. Instructions are located  on the last page of this paperwork. If you have not heard from us regarding the results in 2 weeks, please contact this office.      

## 2018-03-25 NOTE — Progress Notes (Signed)
    MRN: [id number] DOB: May 01, 1991  Subjective:   Pamela Carpenter is a 27 y.o. female presenting for several day history of itchy rash over legs that is oozing.  She believes she came into contact with poison ivy.  Has been using hydrocortisone cream with minimal relief.  Denies drainage of pus, pain.  Rash does not involve oral cavity or genital area.  Philmore PaliBriona has a current medication list which includes the following prescription(s): alprazolam. Also is allergic to amoxicillin.  Philmore PaliBriona  has a past medical history of Asthma, Breast mass, BV (bacterial vaginosis), Kidney stones, and Yeast infection. Also  has a past surgical history that includes Wisdom tooth extraction.  Objective:   Vitals: BP 132/75   Pulse 96   Temp 98.5 F (36.9 C) (Oral)   Resp 17   Ht 5' (1.524 m)   Wt 118 lb (53.5 kg)   SpO2 98%   BMI 23.05 kg/m   Physical Exam  Constitutional: She is oriented to person, place, and time. She appears well-developed and well-nourished.  HENT:  Mouth/Throat: Oropharynx is clear and moist.  Cardiovascular: Normal rate.  Pulmonary/Chest: Effort normal.  Neurological: She is alert and oriented to person, place, and time.  Skin: Skin is warm and dry. Rash (clusters of erythematous urticarial type lesions with a few vesicular lesions that have very minimal clear drainage) noted.   Assessment and Plan :   Allergic contact dermatitis due to plants, except food - Plan: DISCONTINUED: triamcinolone cream (KENALOG) 0.1 %  We will try a Kenalog cream to address contact dermatitis secondary to plant.  Recommended patient use Zyrtec and Zantac as well for antihistamine properties against contact dermatitis.  Return to clinic precautions reviewed.  Wallis BambergMario Viral Schramm, PA-C [facility] at [facility] [phone] 03/25/2018  4:45 PM

## 2018-03-26 ENCOUNTER — Encounter: Payer: Self-pay | Admitting: Urgent Care

## 2018-04-29 DIAGNOSIS — Z01419 Encounter for gynecological examination (general) (routine) without abnormal findings: Secondary | ICD-10-CM | POA: Diagnosis not present

## 2018-04-29 DIAGNOSIS — N643 Galactorrhea not associated with childbirth: Secondary | ICD-10-CM | POA: Diagnosis not present

## 2018-04-29 DIAGNOSIS — Z6823 Body mass index (BMI) 23.0-23.9, adult: Secondary | ICD-10-CM | POA: Diagnosis not present

## 2018-09-05 DIAGNOSIS — N898 Other specified noninflammatory disorders of vagina: Secondary | ICD-10-CM | POA: Diagnosis not present

## 2018-09-12 DIAGNOSIS — Z6824 Body mass index (BMI) 24.0-24.9, adult: Secondary | ICD-10-CM | POA: Diagnosis not present

## 2018-09-12 DIAGNOSIS — Z0000 Encounter for general adult medical examination without abnormal findings: Secondary | ICD-10-CM | POA: Diagnosis not present

## 2018-09-15 DIAGNOSIS — H6503 Acute serous otitis media, bilateral: Secondary | ICD-10-CM | POA: Diagnosis not present

## 2018-09-15 DIAGNOSIS — J069 Acute upper respiratory infection, unspecified: Secondary | ICD-10-CM | POA: Diagnosis not present

## 2018-09-24 DIAGNOSIS — N941 Unspecified dyspareunia: Secondary | ICD-10-CM | POA: Diagnosis not present

## 2018-09-24 DIAGNOSIS — R102 Pelvic and perineal pain: Secondary | ICD-10-CM | POA: Diagnosis not present

## 2018-09-24 DIAGNOSIS — N8301 Follicular cyst of right ovary: Secondary | ICD-10-CM | POA: Diagnosis not present

## 2019-03-18 DIAGNOSIS — Z1159 Encounter for screening for other viral diseases: Secondary | ICD-10-CM | POA: Diagnosis not present

## 2019-04-24 DIAGNOSIS — Z30432 Encounter for removal of intrauterine contraceptive device: Secondary | ICD-10-CM | POA: Diagnosis not present

## 2019-04-29 DIAGNOSIS — Z6824 Body mass index (BMI) 24.0-24.9, adult: Secondary | ICD-10-CM | POA: Diagnosis not present

## 2019-04-29 DIAGNOSIS — Z01419 Encounter for gynecological examination (general) (routine) without abnormal findings: Secondary | ICD-10-CM | POA: Diagnosis not present

## 2019-07-29 DIAGNOSIS — Z1159 Encounter for screening for other viral diseases: Secondary | ICD-10-CM | POA: Diagnosis not present

## 2019-07-29 DIAGNOSIS — Z20828 Contact with and (suspected) exposure to other viral communicable diseases: Secondary | ICD-10-CM | POA: Diagnosis not present

## 2019-08-08 ENCOUNTER — Telehealth (HOSPITAL_COMMUNITY): Payer: Self-pay | Admitting: Urgent Care

## 2019-08-08 MED ORDER — ONDANSETRON 4 MG PO TBDP: 4.0000 mg | ORAL_TABLET | Freq: Three times a day (TID) | ORAL | 0 refills | Status: DC | PRN

## 2019-08-08 NOTE — Telephone Encounter (Signed)
Patient reports nausea in pregnancy.  Due to the weekend is not able to get in touch with her obstetrician.  Will send in prescription for Zofran.  Patient is to follow-up with her obstetrician.

## 2019-08-21 NOTE — L&D Delivery Note (Signed)
Delivery Note:   G2P1001 at [redacted]w[redacted]d  Admitting diagnosis: Normal labor [O80, Z37.9] Risks: Rubella non-immune Onset of labor: 0245 IOL/Augmentation: N/A ROM: 0245  Complete dilation at 04/09/2020  0543 Onset of pushing at 0545 FHR second stage Cat 1  Analgesia /Anesthesia intrapartum:None  Pushing in L lateral position with CNM and L&D staff support, FOB and mother present for birth and supportive.  Delivery of a Live born female  Birth Weight:  2693 g Weight:, English: 5 lb 15 oz APGAR: 9, 9  Newborn Delivery   Birth date/time: 04/09/2020 05:47:00 Delivery type: Vaginal, Spontaneous      in cephalic presentation, position OA to LOT.  Nuchal Cord: No , body cord noted Cord double clamped after cessation of pulsation, cut by mother.  Collection of cord blood for typing completed. Cord blood donation-None  Arterial cord blood sample-No    Placenta delivered-Spontaneous  with 3 vessels . Uterotonics: Pitocin IV bolus Placenta to L&D for disposal. Uterine tone firm, bleeding small  None  laceration identified.  Episiotomy:None  Local analgesia: NA  Est. Blood Loss (mL):100.00   Complications: None   Mom to postpartum.  Baby [place] to Couplet care / Skin to Skin.  Delivery Report:   Review the Delivery Report for details.     Signed: Neta Mends, CNM, MSN 04/09/2020, 12:01 PM

## 2019-08-25 ENCOUNTER — Inpatient Hospital Stay (HOSPITAL_COMMUNITY): Payer: BC Managed Care – PPO

## 2019-08-25 ENCOUNTER — Other Ambulatory Visit: Payer: Self-pay

## 2019-08-25 ENCOUNTER — Inpatient Hospital Stay (HOSPITAL_COMMUNITY)
Admission: AD | Admit: 2019-08-25 | Discharge: 2019-08-25 | Disposition: A | Discharge status: Home / Self Care | Payer: BC Managed Care – PPO | Attending: Obstetrics and Gynecology | Admitting: Obstetrics and Gynecology

## 2019-08-25 ENCOUNTER — Encounter (HOSPITAL_COMMUNITY): Payer: Self-pay | Admitting: Family Medicine

## 2019-08-25 DIAGNOSIS — O26851 Spotting complicating pregnancy, first trimester: Secondary | ICD-10-CM | POA: Diagnosis not present

## 2019-08-25 DIAGNOSIS — N939 Abnormal uterine and vaginal bleeding, unspecified: Secondary | ICD-10-CM

## 2019-08-25 DIAGNOSIS — O26891 Other specified pregnancy related conditions, first trimester: Secondary | ICD-10-CM | POA: Insufficient documentation

## 2019-08-25 DIAGNOSIS — Z6790 Unspecified blood type, Rh positive: Secondary | ICD-10-CM | POA: Insufficient documentation

## 2019-08-25 DIAGNOSIS — R109 Unspecified abdominal pain: Secondary | ICD-10-CM | POA: Insufficient documentation

## 2019-08-25 DIAGNOSIS — Z833 Family history of diabetes mellitus: Secondary | ICD-10-CM | POA: Insufficient documentation

## 2019-08-25 DIAGNOSIS — O99511 Diseases of the respiratory system complicating pregnancy, first trimester: Secondary | ICD-10-CM | POA: Insufficient documentation

## 2019-08-25 DIAGNOSIS — J45909 Unspecified asthma, uncomplicated: Secondary | ICD-10-CM | POA: Diagnosis not present

## 2019-08-25 DIAGNOSIS — O209 Hemorrhage in early pregnancy, unspecified: Secondary | ICD-10-CM | POA: Insufficient documentation

## 2019-08-25 DIAGNOSIS — Z3490 Encounter for supervision of normal pregnancy, unspecified, unspecified trimester: Secondary | ICD-10-CM

## 2019-08-25 DIAGNOSIS — Z3A01 Less than 8 weeks gestation of pregnancy: Secondary | ICD-10-CM | POA: Diagnosis not present

## 2019-08-25 DIAGNOSIS — Z79899 Other long term (current) drug therapy: Secondary | ICD-10-CM | POA: Insufficient documentation

## 2019-08-25 DIAGNOSIS — O99891 Other specified diseases and conditions complicating pregnancy: Secondary | ICD-10-CM | POA: Insufficient documentation

## 2019-08-25 DIAGNOSIS — Z880 Allergy status to penicillin: Secondary | ICD-10-CM | POA: Diagnosis not present

## 2019-08-25 LAB — COMPREHENSIVE METABOLIC PANEL
ALT: 15 U/L (ref 0–44)
AST: 17 U/L (ref 15–41)
Albumin: 4.4 g/dL (ref 3.5–5.0)
Alkaline Phosphatase: 35 U/L — ABNORMAL LOW (ref 38–126)
Anion gap: 11 (ref 5–15)
BUN: 8 mg/dL (ref 6–20)
CO2: 22 mmol/L (ref 22–32)
Calcium: 9.5 mg/dL (ref 8.9–10.3)
Chloride: 103 mmol/L (ref 98–111)
Creatinine, Ser: 0.59 mg/dL (ref 0.44–1.00)
GFR calc Af Amer: >60 mL/min (ref >60)
GFR calc non Af Amer: >60 mL/min (ref >60)
Glucose, Bld: 91 mg/dL (ref 70–99)
Potassium: 3.7 mmol/L (ref 3.5–5.1)
Sodium: 136 mmol/L (ref 135–145)
Total Bilirubin: 0.4 mg/dL (ref 0.3–1.2)
Total Protein: 7.3 g/dL (ref 6.5–8.1)

## 2019-08-25 LAB — CBC
HCT: 38.9 % (ref 36.0–46.0)
Hemoglobin: 13.5 g/dL (ref 12.0–15.0)
MCH: 31.5 pg (ref 26.0–34.0)
MCHC: 34.7 g/dL (ref 30.0–36.0)
MCV: 90.7 fL (ref 80.0–100.0)
Platelets: 267 10*3/uL (ref 150–400)
RBC: 4.29 MIL/uL (ref 3.87–5.11)
RDW: 12.2 % (ref 11.5–15.5)
WBC: 11.3 10*3/uL — ABNORMAL HIGH (ref 4.0–10.5)
nRBC: 0 % (ref 0.0–0.2)

## 2019-08-25 LAB — WET PREP, GENITAL
Clue Cells Wet Prep HPF POC: NONE SEEN
Sperm: NONE SEEN
Trich, Wet Prep: NONE SEEN
Yeast Wet Prep HPF POC: NONE SEEN

## 2019-08-25 LAB — URINALYSIS, ROUTINE W REFLEX MICROSCOPIC
Bilirubin Urine: NEGATIVE
Glucose, UA: NEGATIVE mg/dL
Ketones, ur: NEGATIVE mg/dL
Leukocytes,Ua: NEGATIVE
Nitrite: NEGATIVE
Protein, ur: NEGATIVE mg/dL
Specific Gravity, Urine: 1.005 (ref 1.005–1.030)
pH: 6 (ref 5.0–8.0)

## 2019-08-25 LAB — POCT PREGNANCY, URINE: Preg Test, Ur: POSITIVE — AB

## 2019-08-25 LAB — ABO/RH: ABO/RH(D): O POS

## 2019-08-25 LAB — HCG, QUANTITATIVE, PREGNANCY: hCG, Beta Chain, Quant, S: 136971 m[IU]/mL — ABNORMAL HIGH (ref <5)

## 2019-08-25 NOTE — Discharge Instructions (Signed)
Safe Medications in Pregnancy    Acne: Benzoyl Peroxide Salicylic Acid  Backache/Headache: Tylenol: 2 regular strength every 4 hours OR              2 Extra strength every 6 hours  Colds/Coughs/Allergies: Benadryl (alcohol free) 25 mg every 6 hours as needed Breath right strips Claritin Cepacol throat lozenges Chloraseptic throat spray Cold-Eeze- up to three times per day Cough drops, alcohol free Flonase (by prescription only) Guaifenesin Mucinex Robitussin DM (plain only, alcohol free) Saline nasal spray/drops Sudafed (pseudoephedrine) & Actifed ** use only after [redacted] weeks gestation and if you do not have high blood pressure Tylenol Vicks Vaporub Zinc lozenges Zyrtec   Constipation: Colace Ducolax suppositories Fleet enema Glycerin suppositories Metamucil Milk of magnesia Miralax Senokot Smooth move tea  Diarrhea: Kaopectate Imodium A-D  *NO pepto Bismol  Hemorrhoids: Anusol Anusol HC Preparation H Tucks  Indigestion: Tums Maalox Mylanta Zantac  Pepcid  Insomnia: Benadryl (alcohol free) 25mg  every 6 hours as needed Tylenol PM Unisom, no Gelcaps  Leg Cramps: Tums MagGel  Nausea/Vomiting:  Bonine Dramamine Emetrol Ginger extract Sea bands Meclizine  Nausea medication to take during pregnancy:  Unisom (doxylamine succinate 25 mg tablets) Take one tablet daily at bedtime. If symptoms are not adequately controlled, the dose can be increased to a maximum recommended dose of two tablets daily (1/2 tablet in the morning, 1/2 tablet mid-afternoon and one at bedtime). Vitamin B6 100mg  tablets. Take one tablet twice a day (up to 200 mg per day).  Skin Rashes: Aveeno products Benadryl cream or 25mg  every 6 hours as needed Calamine Lotion 1% cortisone cream  Yeast infection: Gyne-lotrimin 7 Monistat 7   **If taking multiple medications, please check labels to avoid duplicating the same active ingredients **take  medication as directed on the label ** Do not exceed 4000 mg of tylenol in 24 hours **Do not take medications that contain aspirin or ibuprofen    First Trimester of Pregnancy The first trimester of pregnancy is from week 1 until the end of week 13 (months 1 through 3). A week after a sperm fertilizes an egg, the egg will implant on the wall of the uterus. This embryo will begin to develop into a baby. Genes from you and your partner will form the baby. The female genes will determine whether the baby will be a boy or a girl. At 6-8 weeks, the eyes and face will be formed, and the heartbeat can be seen on ultrasound. At the end of 12 weeks, all the baby's organs will be formed. Now that you are pregnant, you will want to do everything you can to have a healthy baby. Two of the most important things are to get good prenatal care and to follow your health care provider's instructions. Prenatal care is all the medical care you receive before the baby's birth. This care will help prevent, find, and treat any problems during the pregnancy and childbirth. Body changes during your first trimester Your body goes through many changes during pregnancy. The changes vary from woman to woman.  You may gain or lose a couple of pounds at first.  You may feel sick to your stomach (nauseous) and you may throw up (vomit). If the vomiting is uncontrollable, call your health care provider.  You may tire easily.  You may develop headaches that can be relieved by medicines. All medicines should be approved by your health care provider.  You may urinate more often. Painful urination may mean you have  a bladder infection.  You may develop heartburn as a result of your pregnancy.  You may develop constipation because certain hormones are causing the muscles that push stool through your intestines to slow down.  You may develop hemorrhoids or swollen veins (varicose veins).  Your breasts may begin to grow larger and  become tender. Your nipples may stick out more, and the tissue that surrounds them (areola) may become darker.  Your gums may bleed and may be sensitive to brushing and flossing.  Dark spots or blotches (chloasma, mask of pregnancy) may develop on your face. This will likely fade after the baby is born.  Your menstrual periods will stop.  You may have a loss of appetite.  You may develop cravings for certain kinds of food.  You may have changes in your emotions from day to day, such as being excited to be pregnant or being concerned that something may go wrong with the pregnancy and baby.  You may have more vivid and strange dreams.  You may have changes in your hair. These can include thickening of your hair, rapid growth, and changes in texture. Some women also have hair loss during or after pregnancy, or hair that feels dry or thin. Your hair will most likely return to normal after your baby is born. What to expect at prenatal visits During a routine prenatal visit:  You will be weighed to make sure you and the baby are growing normally.  Your blood pressure will be taken.  Your abdomen will be measured to track your baby's growth.  The fetal heartbeat will be listened to between weeks 10 and 14 of your pregnancy.  Test results from any previous visits will be discussed. Your health care provider may ask you:  How you are feeling.  If you are feeling the baby move.  If you have had any abnormal symptoms, such as leaking fluid, bleeding, severe headaches, or abdominal cramping.  If you are using any tobacco products, including cigarettes, chewing tobacco, and electronic cigarettes.  If you have any questions. Other tests that may be performed during your first trimester include:  Blood tests to find your blood type and to check for the presence of any previous infections. The tests will also be used to check for low iron levels (anemia) and protein on red blood cells (Rh  antibodies). Depending on your risk factors, or if you previously had diabetes during pregnancy, you may have tests to check for high blood sugar that affects pregnant women (gestational diabetes).  Urine tests to check for infections, diabetes, or protein in the urine.  An ultrasound to confirm the proper growth and development of the baby.  Fetal screens for spinal cord problems (spina bifida) and Down syndrome.  HIV (human immunodeficiency virus) testing. Routine prenatal testing includes screening for HIV, unless you choose not to have this test.  You may need other tests to make sure you and the baby are doing well. Follow these instructions at home: Medicines  Follow your health care provider's instructions regarding medicine use. Specific medicines may be either safe or unsafe to take during pregnancy.  Take a prenatal vitamin that contains at least 600 micrograms (mcg) of folic acid.  If you develop constipation, try taking a stool softener if your health care provider approves. Eating and drinking   Eat a balanced diet that includes fresh fruits and vegetables, whole grains, good sources of protein such as meat, eggs, or tofu, and low-fat dairy. Your health  care provider will help you determine the amount of weight gain that is right for you.  Avoid raw meat and uncooked cheese. These carry germs that can cause birth defects in the baby.  Eating four or five small meals rather than three large meals a day may help relieve nausea and vomiting. If you start to feel nauseous, eating a few soda crackers can be helpful. Drinking liquids between meals, instead of during meals, also seems to help ease nausea and vomiting.  Limit foods that are high in fat and processed sugars, such as fried and sweet foods.  To prevent constipation: ? Eat foods that are high in fiber, such as fresh fruits and vegetables, whole grains, and beans. ? Drink enough fluid to keep your urine clear or pale  yellow. Activity  Exercise only as directed by your health care provider. Most women can continue their usual exercise routine during pregnancy. Try to exercise for 30 minutes at least 5 days a week. Exercising will help you: ? Control your weight. ? Stay in shape. ? Be prepared for labor and delivery.  Experiencing pain or cramping in the lower abdomen or lower back is a good sign that you should stop exercising. Check with your health care provider before continuing with normal exercises.  Try to avoid standing for long periods of time. Move your legs often if you must stand in one place for a long time.  Avoid heavy lifting.  Wear low-heeled shoes and practice good posture.  You may continue to have sex unless your health care provider tells you not to. Relieving pain and discomfort  Wear a good support bra to relieve breast tenderness.  Take warm sitz baths to soothe any pain or discomfort caused by hemorrhoids. Use hemorrhoid cream if your health care provider approves.  Rest with your legs elevated if you have leg cramps or low back pain.  If you develop varicose veins in your legs, wear support hose. Elevate your feet for 15 minutes, 3-4 times a day. Limit salt in your diet. Prenatal care  Schedule your prenatal visits by the twelfth week of pregnancy. They are usually scheduled monthly at first, then more often in the last 2 months before delivery.  Write down your questions. Take them to your prenatal visits.  Keep all your prenatal visits as told by your health care provider. This is important. Safety  Wear your seat belt at all times when driving.  Make a list of emergency phone numbers, including numbers for family, friends, the hospital, and police and fire departments. General instructions  Ask your health care provider for a referral to a local prenatal education class. Begin classes no later than the beginning of month 6 of your pregnancy.  Ask for help if  you have counseling or nutritional needs during pregnancy. Your health care provider can offer advice or refer you to specialists for help with various needs.  Do not use hot tubs, steam rooms, or saunas.  Do not douche or use tampons or scented sanitary pads.  Do not cross your legs for long periods of time.  Avoid cat litter boxes and soil used by cats. These carry germs that can cause birth defects in the baby and possibly loss of the fetus by miscarriage or stillbirth.  Avoid all smoking, herbs, alcohol, and medicines not prescribed by your health care provider. Chemicals in these products affect the formation and growth of the baby.  Do not use any products that contain nicotine  or tobacco, such as cigarettes and e-cigarettes. If you need help quitting, ask your health care provider. You may receive counseling support and other resources to help you quit.  Schedule a dentist appointment. At home, brush your teeth with a soft toothbrush and be gentle when you floss. Contact a health care provider if:  You have dizziness.  You have mild pelvic cramps, pelvic pressure, or nagging pain in the abdominal area.  You have persistent nausea, vomiting, or diarrhea.  You have a bad smelling vaginal discharge.  You have pain when you urinate.  You notice increased swelling in your face, hands, legs, or ankles.  You are exposed to fifth disease or chickenpox.  You are exposed to [place] measles (rubella) and have never had it. Get help right away if:  You have a fever.  You are leaking fluid from your vagina.  You have spotting or bleeding from your vagina.  You have severe abdominal cramping or pain.  You have rapid weight gain or loss.  You vomit blood or material that looks like coffee grounds.  You develop a severe headache.  You have shortness of breath.  You have any kind of trauma, such as from a fall or a car accident. Summary  The first trimester of pregnancy is  from week 1 until the end of week 13 (months 1 through 3).  Your body goes through many changes during pregnancy. The changes vary from woman to woman.  You will have routine prenatal visits. During those visits, your health care provider will examine you, discuss any test results you may have, and talk with you about how you are feeling. This information is not intended to replace advice given to you by your health care provider. Make sure you discuss any questions you have with your health care provider. Document Revised: 07/19/2017 Document Reviewed: 07/18/2016 Elsevier Patient Education  2020 [employer] Vaginal Bleeding During Pregnancy, First Trimester  A small amount of bleeding from the vagina (spotting) is relatively common during early pregnancy. It usually stops on its own. Various things may cause bleeding or spotting during early pregnancy. Some bleeding may be related to the pregnancy, and some may not. In many cases, the bleeding is normal and is not a problem. However, bleeding can also be a sign of something serious. Be sure to tell your health care provider about any vaginal bleeding right away. Some possible causes of vaginal bleeding during the first trimester include:  Infection or inflammation of the cervix.  Growths (polyps) on the cervix.  Miscarriage or threatened miscarriage.  Pregnancy tissue developing outside of the uterus (ectopic pregnancy).  A mass of tissue developing in the uterus due to an egg being fertilized incorrectly (molar pregnancy). Follow these instructions at home: Activity  Follow instructions from your health care provider about limiting your activity. Ask what activities are safe for you.  If needed, make plans for someone to help with your regular activities.  Do not have sex or orgasms until your health care provider says that this is safe. General instructions  Take over-the-counter and prescription medicines only as told by your  health care provider.  Pay attention to any changes in your symptoms.  Do not use tampons or douche.  Write down how many pads you use each day, how often you change pads, and how soaked (saturated) they are.  If you pass any tissue from your vagina, save the tissue so you can show it to your health care provider.  Keep all follow-up visits as told by your health care provider. This is important. Contact a health care provider if:  You have vaginal bleeding during any part of your pregnancy.  You have cramps or labor pains.  You have a fever. Get help right away if:  You have severe cramps in your back or abdomen.  You pass large clots or a large amount of tissue from your vagina.  Your bleeding increases.  You feel light-headed or weak, or you faint.  You have chills.  You are leaking fluid or have a gush of fluid from your vagina. Summary  A small amount of bleeding (spotting) from the vagina is relatively common during early pregnancy.  Various things may cause bleeding or spotting in early pregnancy.  Be sure to tell your health care provider about any vaginal bleeding right away. This information is not intended to replace advice given to you by your health care provider. Make sure you discuss any questions you have with your health care provider. Document Revised: 11/25/2018 Document Reviewed: 11/08/2016 Elsevier Patient Education  2020 [employer]

## 2019-08-25 NOTE — MAU Provider Note (Signed)
History     CSN: [id number]  Arrival date and time: 08/25/19 1831   First Provider Initiated Contact with Patient 08/25/19 1914      Chief Complaint  Patient presents with  . Vaginal Bleeding  . Abdominal Pain  . Possible Pregnancy   Ms. Pamela Carpenter is a 29 y.o. G2P1001 at [redacted]w[redacted]d who presents to MAU for vaginal bleeding which began 2 days ago. Pt reports it is "very, very light." Pt reports she sees the spotting on a panty liner.  Passing blood clots? no Blood soaking clothes? no Lightheaded/dizzy? no Significant pelvic pain or cramping? No - very low and very mild, 1/10 pain "if that, more of a tightening than a cramping" Passed any tissue? no  Current pregnancy problems? Pt has not yet been seen Blood Type? unknown Allergies? AMOX Current medications? PNVs, Fe, VitaminB Current PNC & next appt? [facility], 09/22/2019  Pt denies vaginal discharge/odor/itching. Pt denies N/V, abdominal pain, constipation, diarrhea, or urinary problems. Pt denies fever, chills, fatigue, sweating or changes in appetite. Pt denies SOB or chest pain. Pt denies dizziness, HA, light-headedness, weakness.   OB History    Gravida  2   Para  1   Term  1   Preterm      AB      Living  1     SAB      TAB      Ectopic      Multiple      Live Births  1           Past Medical History:  Diagnosis Date  . Asthma    exercised induced  . Breast mass   . BV (bacterial vaginosis)   . Kidney stones   . Yeast infection     Past Surgical History:  Procedure Laterality Date  . WISDOM TOOTH EXTRACTION      Family History  Problem Relation Age of Onset  . Hypertension Father   . Diabetes Maternal Grandmother   . Diabetes Maternal Grandfather   . Heart disease Maternal Grandfather     Social History   Tobacco Use  . Smoking status: Never Smoker  . Smokeless tobacco: Never Used  Substance Use Topics  . Alcohol use: Not Currently  . Drug use: No    Allergies:  Allergies   Allergen Reactions  . Amoxicillin Hives    Medications Prior to Admission  Medication Sig Dispense Refill Last Dose  . Ferrous Sulfate (IRON PO) Take by mouth.   Past Week at Unknown time  . Multiple Vitamins-Minerals (M2 B-60 PO) Take by mouth.   Past Week at Unknown time  . Prenatal Vit-Fe Fumarate-FA (PRENATAL MULTIVITAMIN) TABS tablet Take 1 tablet by mouth daily at 12 noon.   Past Week at Unknown time  . ALPRAZolam (XANAX) 0.25 MG tablet Take 1-2 tablets (0.25-0.5 mg total) by mouth at bedtime as needed for anxiety. 30 tablet 0 More than a month at Unknown time  . ondansetron (ZOFRAN-ODT) 4 MG disintegrating tablet Take 1-2 tablets (4-8 mg total) by mouth every 8 (eight) hours as needed for nausea or vomiting. 30 tablet 0   . triamcinolone cream (KENALOG) 0.1 % Apply 1 application topically 2 (two) times daily. 30 g 1     Review of Systems  Constitutional: Negative for chills, diaphoresis, fatigue and fever.  Eyes: Negative for visual disturbance.  Respiratory: Negative for shortness of breath.   Cardiovascular: Negative for chest pain.  Gastrointestinal: Negative for abdominal pain, constipation,  diarrhea, nausea and vomiting.  Genitourinary: Positive for pelvic pain (mild cramping) and vaginal bleeding (spotting). Negative for dysuria, flank pain, frequency, urgency and vaginal discharge.  Neurological: Negative for dizziness, weakness, light-headedness and headaches.   Physical Exam   Blood pressure 125/73, pulse 88, temperature 99 F (37.2 C), temperature source Oral, resp. rate 16, height 4' 11.5" (1.511 m), weight 57.2 kg, last menstrual period 07/09/2019, SpO2 100 %.  Patient Vitals for the past 24 hrs:  BP Temp Temp src Pulse Resp SpO2 Height Weight  08/25/19 1853 125/73 99 F (37.2 C) Oral 88 16 100 % 4' 11.5" (1.511 m) 57.2 kg   Physical Exam  Constitutional: She is oriented to person, place, and time. She appears well-developed and well-nourished. No distress.   HENT:  Head: Normocephalic and atraumatic.  Respiratory: Effort normal.  GI: Soft. She exhibits no distension and no mass. There is no abdominal tenderness. There is no rebound and no guarding.  Genitourinary: There is no rash, tenderness or lesion on the right labia. There is no rash, tenderness or lesion on the left labia. Uterus is not enlarged and not tender. Cervix exhibits no motion tenderness, no discharge and no friability. Right adnexum displays no mass, no tenderness and no fullness. Left adnexum displays no mass, no tenderness and no fullness.    No vaginal discharge, tenderness or bleeding.  No tenderness or bleeding in the vagina.  Neurological: She is alert and oriented to person, place, and time.  Skin: Skin is warm and dry. She is not diaphoretic.  Psychiatric: She has a normal mood and affect. Her behavior is normal. Judgment and thought content normal.   Results for orders placed or performed during the hospital encounter of 08/25/19 (from the past 24 hour(s))  Urinalysis, Routine w reflex microscopic     Status: Abnormal   Collection Time: 08/25/19  6:58 PM  Result Value Ref Range   Color, Urine STRAW (A) YELLOW   APPearance HAZY (A) CLEAR   Specific Gravity, Urine 1.005 1.005 - 1.030   pH 6.0 5.0 - 8.0   Glucose, UA NEGATIVE NEGATIVE mg/dL   Hgb urine dipstick SMALL (A) NEGATIVE   Bilirubin Urine NEGATIVE NEGATIVE   Ketones, ur NEGATIVE NEGATIVE mg/dL   Protein, ur NEGATIVE NEGATIVE mg/dL   Nitrite NEGATIVE NEGATIVE   Leukocytes,Ua NEGATIVE NEGATIVE   RBC / HPF 0-5 0 - 5 RBC/hpf   WBC, UA 0-5 0 - 5 WBC/hpf   Bacteria, UA RARE (A) NONE SEEN   Squamous Epithelial / LPF 11-20 0 - 5   Mucus PRESENT   Pregnancy, urine POC     Status: Abnormal   Collection Time: 08/25/19  6:58 PM  Result Value Ref Range   Preg Test, Ur POSITIVE (A) NEGATIVE  CBC     Status: Abnormal   Collection Time: 08/25/19  7:17 PM  Result Value Ref Range   WBC 11.3 (H) 4.0 - 10.5 K/uL   RBC  4.29 3.87 - 5.11 MIL/uL   Hemoglobin 13.5 12.0 - 15.0 g/dL   HCT 38.9 36.0 - 46.0 %   MCV 90.7 80.0 - 100.0 fL   MCH 31.5 26.0 - 34.0 pg   MCHC 34.7 30.0 - 36.0 g/dL   RDW 12.2 11.5 - 15.5 %   Platelets 267 150 - 400 K/uL   nRBC 0.0 0.0 - 0.2 %  Comprehensive metabolic panel     Status: Abnormal   Collection Time: 08/25/19  7:17 PM  Result Value Ref Range  Sodium 136 135 - 145 mmol/L   Potassium 3.7 3.5 - 5.1 mmol/L   Chloride 103 98 - 111 mmol/L   CO2 22 22 - 32 mmol/L   Glucose, Bld 91 70 - 99 mg/dL   BUN 8 6 - 20 mg/dL   Creatinine, Ser 7.89 0.44 - 1.00 mg/dL   Calcium 9.5 8.9 - 38.1 mg/dL   Total Protein 7.3 6.5 - 8.1 g/dL   Albumin 4.4 3.5 - 5.0 g/dL   AST 17 15 - 41 U/L   ALT 15 0 - 44 U/L   Alkaline Phosphatase 35 (L) 38 - 126 U/L   Total Bilirubin 0.4 0.3 - 1.2 mg/dL   GFR calc non Af Amer >60 >60 mL/min   GFR calc Af Amer >60 >60 mL/min   Anion gap 11 5 - 15  hCG, quantitative, pregnancy     Status: Abnormal   Collection Time: 08/25/19  7:17 PM  Result Value Ref Range   hCG, Beta Chain, Quant, S 136,971 (H) <5 mIU/mL  ABO/Rh     Status: None   Collection Time: 08/25/19  7:17 PM  Result Value Ref Range   ABO/RH(D) O POS    No rh immune globuloin      NOT A RH IMMUNE GLOBULIN CANDIDATE, PT RH POSITIVE Performed at [facility], [address] [place], [place] [address]   Wet prep, genital     Status: Abnormal   Collection Time: 08/25/19  7:20 PM   Specimen: PATH Cytology Cervicovaginal Ancillary Only  Result Value Ref Range   Yeast Wet Prep HPF POC NONE SEEN NONE SEEN   Trich, Wet Prep NONE SEEN NONE SEEN   Clue Cells Wet Prep HPF POC NONE SEEN NONE SEEN   WBC, Wet Prep HPF POC MODERATE (A) NONE SEEN   Sperm NONE SEEN    US OB LESS THAN 14 WEEKS WITH OB TRANSVAGINAL  Result Date: 08/25/2019 CLINICAL DATA:  Cramping. EXAM: OBSTETRIC <14 WK [place] AND TRANSVAGINAL OB US TECHNIQUE: Both transabdominal and transvaginal ultrasound examinations were performed  for complete evaluation of the gestation as well as the maternal uterus, adnexal regions, and pelvic cul-de-sac. Transvaginal technique was performed to assess early pregnancy. COMPARISON:  None. FINDINGS: Intrauterine gestational sac: Single Yolk sac:  Visualized. Embryo:  Visualized. Cardiac Activity: Visualized. Heart Rate: 126 bpm CRL: 7.6 mm   6 w   4 d                  [place] EDC: 04/15/2020 Subchorionic hemorrhage:  None visualized. Maternal uterus/adnexae: There is no significant maternal abnormality. There is a trace amount of free fluid in the patient's pelvis. IMPRESSION: Single live IUP at 6 weeks and 4 days as detailed above. Electronically Signed   By: Katherine Mantle M.D.   On: 08/25/2019 20:43    MAU Course  Procedures  MDM -r/o ectopic -UA: straw/hazy/sm hgb, sending urine for culture -CBC: WNL -CMP: WNL -[place]: single IUP, FHR 126, [redacted]w[redacted]d, no significant maternal abnormality, trace free fluid in pelvis, no SCH visualized -hCG: 025,852 -ABO: O Positive -WetPrep: WNL -GC/CT collected -pt discharged to home in stable condition  Orders Placed This Encounter  Procedures  . Wet prep, genital    Standing Status:   Standing    Number of Occurrences:   1  . Culture, OB Urine    Standing Status:   Standing    Number of Occurrences:   1  . US OB LESS THAN 14  WEEKS WITH OB TRANSVAGINAL    Standing Status:   Standing    Number of Occurrences:   1    Order Specific Question:   Symptom/Reason for Exam    Answer:   Vaginal spotting [[id number]]  . Urinalysis, Routine w reflex microscopic    Standing Status:   Standing    Number of Occurrences:   1  . CBC    Standing Status:   Standing    Number of Occurrences:   1  . Comprehensive metabolic panel    Standing Status:   Standing    Number of Occurrences:   1  . hCG, quantitative, pregnancy    Standing Status:   Standing    Number of Occurrences:   1  . Pregnancy, urine POC    Standing Status:   Standing    Number of Occurrences:    1  . ABO/Rh    Standing Status:   Standing    Number of Occurrences:   1  . Discharge patient    Order Specific Question:   Discharge disposition    Answer:   01-Home or Self Care [1]    Order Specific Question:   Discharge patient date    Answer:   08/25/2019   Assessment and Plan   1. Vaginal spotting   2. Intrauterine pregnancy   3. [redacted] weeks gestation of pregnancy   4. Blood type, Rh positive    Allergies as of 08/25/2019      Reactions   Amoxicillin Hives      Medication List    TAKE these medications   ALPRAZolam 0.25 MG tablet Commonly known as: XANAX Take 1-2 tablets (0.25-0.5 mg total) by mouth at bedtime as needed for anxiety.   IRON PO Take by mouth.   M2 B-60 PO Take by mouth.   ondansetron 4 MG disintegrating tablet Commonly known as: ZOFRAN-ODT Take 1-2 tablets (4-8 mg total) by mouth every 8 (eight) hours as needed for nausea or vomiting.   prenatal multivitamin Tabs tablet Take 1 tablet by mouth daily at 12 noon.   triamcinolone cream 0.1 % Commonly known as: KENALOG Apply 1 application topically 2 (two) times daily.      -will call with culture results, if positive -discussed use of Zofran in early pregnancy, pt reports has only taken once and is not having issues with N/V -safe meds in pregnancy list given with focus on N/V medications -bleeding/pain/return MAU precautions given -pt discharged to home in stable condition  Joni Reining E Thad Osoria 08/25/2019, 8:55 PM

## 2019-08-25 NOTE — MAU Note (Signed)
Having mild cramping and a little bit of spotting.  Her midwife wanted her to come in.

## 2019-08-26 LAB — CULTURE, OB URINE: Culture: NO GROWTH

## 2019-08-26 LAB — GC/CHLAMYDIA PROBE AMP (~~LOC~~) NOT AT ARMC
Chlamydia: NEGATIVE
Comment: NEGATIVE
Comment: NORMAL
Neisseria Gonorrhea: NEGATIVE

## 2019-09-05 DIAGNOSIS — Z20828 Contact with and (suspected) exposure to other viral communicable diseases: Secondary | ICD-10-CM | POA: Diagnosis not present

## 2019-09-05 DIAGNOSIS — Z1159 Encounter for screening for other viral diseases: Secondary | ICD-10-CM | POA: Diagnosis not present

## 2019-09-10 DIAGNOSIS — Z1159 Encounter for screening for other viral diseases: Secondary | ICD-10-CM | POA: Diagnosis not present

## 2019-09-10 DIAGNOSIS — Z20828 Contact with and (suspected) exposure to other viral communicable diseases: Secondary | ICD-10-CM | POA: Diagnosis not present

## 2019-09-14 DIAGNOSIS — Z1159 Encounter for screening for other viral diseases: Secondary | ICD-10-CM | POA: Diagnosis not present

## 2019-09-14 DIAGNOSIS — Z20828 Contact with and (suspected) exposure to other viral communicable diseases: Secondary | ICD-10-CM | POA: Diagnosis not present

## 2019-09-17 DIAGNOSIS — Z20828 Contact with and (suspected) exposure to other viral communicable diseases: Secondary | ICD-10-CM | POA: Diagnosis not present

## 2019-09-17 DIAGNOSIS — Z1159 Encounter for screening for other viral diseases: Secondary | ICD-10-CM | POA: Diagnosis not present

## 2019-09-21 DIAGNOSIS — Z20828 Contact with and (suspected) exposure to other viral communicable diseases: Secondary | ICD-10-CM | POA: Diagnosis not present

## 2019-09-21 DIAGNOSIS — Z1159 Encounter for screening for other viral diseases: Secondary | ICD-10-CM | POA: Diagnosis not present

## 2019-09-22 DIAGNOSIS — Z3481 Encounter for supervision of other normal pregnancy, first trimester: Secondary | ICD-10-CM | POA: Diagnosis not present

## 2019-09-22 DIAGNOSIS — N925 Other specified irregular menstruation: Secondary | ICD-10-CM | POA: Diagnosis not present

## 2019-09-22 DIAGNOSIS — Z113 Encounter for screening for infections with a predominantly sexual mode of transmission: Secondary | ICD-10-CM | POA: Diagnosis not present

## 2019-09-22 DIAGNOSIS — O3680X9 Pregnancy with inconclusive fetal viability, other fetus: Secondary | ICD-10-CM | POA: Diagnosis not present

## 2019-09-22 DIAGNOSIS — Z3A10 10 weeks gestation of pregnancy: Secondary | ICD-10-CM | POA: Diagnosis not present

## 2019-09-22 DIAGNOSIS — Z31430 Encounter of female for testing for genetic disease carrier status for procreative management: Secondary | ICD-10-CM | POA: Diagnosis not present

## 2019-09-22 LAB — OB RESULTS CONSOLE RUBELLA ANTIBODY, IGM: Rubella: NON-IMMUNE/NOT IMMUNE

## 2019-09-22 LAB — OB RESULTS CONSOLE HEPATITIS B SURFACE ANTIGEN: Hepatitis B Surface Ag: NEGATIVE

## 2019-09-24 DIAGNOSIS — Z20828 Contact with and (suspected) exposure to other viral communicable diseases: Secondary | ICD-10-CM | POA: Diagnosis not present

## 2019-09-24 DIAGNOSIS — Z1159 Encounter for screening for other viral diseases: Secondary | ICD-10-CM | POA: Diagnosis not present

## 2019-09-28 DIAGNOSIS — Z20828 Contact with and (suspected) exposure to other viral communicable diseases: Secondary | ICD-10-CM | POA: Diagnosis not present

## 2019-09-28 DIAGNOSIS — Z1159 Encounter for screening for other viral diseases: Secondary | ICD-10-CM | POA: Diagnosis not present

## 2019-10-01 DIAGNOSIS — Z20828 Contact with and (suspected) exposure to other viral communicable diseases: Secondary | ICD-10-CM | POA: Diagnosis not present

## 2019-10-01 DIAGNOSIS — Z1159 Encounter for screening for other viral diseases: Secondary | ICD-10-CM | POA: Diagnosis not present

## 2019-10-05 DIAGNOSIS — Z20828 Contact with and (suspected) exposure to other viral communicable diseases: Secondary | ICD-10-CM | POA: Diagnosis not present

## 2019-10-05 DIAGNOSIS — Z1159 Encounter for screening for other viral diseases: Secondary | ICD-10-CM | POA: Diagnosis not present

## 2019-10-12 DIAGNOSIS — Z20828 Contact with and (suspected) exposure to other viral communicable diseases: Secondary | ICD-10-CM | POA: Diagnosis not present

## 2019-10-12 DIAGNOSIS — Z1159 Encounter for screening for other viral diseases: Secondary | ICD-10-CM | POA: Diagnosis not present

## 2019-10-15 DIAGNOSIS — Z20828 Contact with and (suspected) exposure to other viral communicable diseases: Secondary | ICD-10-CM | POA: Diagnosis not present

## 2019-10-15 DIAGNOSIS — Z1159 Encounter for screening for other viral diseases: Secondary | ICD-10-CM | POA: Diagnosis not present

## 2019-10-19 DIAGNOSIS — Z20828 Contact with and (suspected) exposure to other viral communicable diseases: Secondary | ICD-10-CM | POA: Diagnosis not present

## 2019-10-19 DIAGNOSIS — Z1159 Encounter for screening for other viral diseases: Secondary | ICD-10-CM | POA: Diagnosis not present

## 2019-10-20 DIAGNOSIS — Z3A14 14 weeks gestation of pregnancy: Secondary | ICD-10-CM | POA: Diagnosis not present

## 2019-10-20 DIAGNOSIS — Z3402 Encounter for supervision of normal first pregnancy, second trimester: Secondary | ICD-10-CM | POA: Diagnosis not present

## 2019-10-21 ENCOUNTER — Other Ambulatory Visit (HOSPITAL_COMMUNITY): Payer: Self-pay | Admitting: Obstetrics and Gynecology

## 2019-10-21 DIAGNOSIS — Z3A19 19 weeks gestation of pregnancy: Secondary | ICD-10-CM

## 2019-10-21 DIAGNOSIS — Z363 Encounter for antenatal screening for malformations: Secondary | ICD-10-CM

## 2019-10-26 DIAGNOSIS — Z1159 Encounter for screening for other viral diseases: Secondary | ICD-10-CM | POA: Diagnosis not present

## 2019-10-26 DIAGNOSIS — Z20828 Contact with and (suspected) exposure to other viral communicable diseases: Secondary | ICD-10-CM | POA: Diagnosis not present

## 2019-11-02 DIAGNOSIS — Z1159 Encounter for screening for other viral diseases: Secondary | ICD-10-CM | POA: Diagnosis not present

## 2019-11-02 DIAGNOSIS — Z20828 Contact with and (suspected) exposure to other viral communicable diseases: Secondary | ICD-10-CM | POA: Diagnosis not present

## 2019-11-09 DIAGNOSIS — Z1159 Encounter for screening for other viral diseases: Secondary | ICD-10-CM | POA: Diagnosis not present

## 2019-11-09 DIAGNOSIS — Z20828 Contact with and (suspected) exposure to other viral communicable diseases: Secondary | ICD-10-CM | POA: Diagnosis not present

## 2019-11-19 DIAGNOSIS — Z1159 Encounter for screening for other viral diseases: Secondary | ICD-10-CM | POA: Diagnosis not present

## 2019-11-19 DIAGNOSIS — Z20828 Contact with and (suspected) exposure to other viral communicable diseases: Secondary | ICD-10-CM | POA: Diagnosis not present

## 2019-11-23 ENCOUNTER — Ambulatory Visit (HOSPITAL_COMMUNITY)
Admission: RE | Admit: 2019-11-23 | Discharge: 2019-11-23 | Disposition: A | Discharge status: Home / Self Care | Payer: BC Managed Care – PPO | Source: Ambulatory Visit | Attending: Obstetrics and Gynecology | Admitting: Obstetrics and Gynecology

## 2019-11-23 ENCOUNTER — Other Ambulatory Visit: Payer: Self-pay

## 2019-11-23 DIAGNOSIS — Z363 Encounter for antenatal screening for malformations: Secondary | ICD-10-CM | POA: Diagnosis not present

## 2019-11-23 DIAGNOSIS — Z20828 Contact with and (suspected) exposure to other viral communicable diseases: Secondary | ICD-10-CM | POA: Diagnosis not present

## 2019-11-23 DIAGNOSIS — Z3A19 19 weeks gestation of pregnancy: Secondary | ICD-10-CM | POA: Diagnosis not present

## 2019-11-23 DIAGNOSIS — Z1159 Encounter for screening for other viral diseases: Secondary | ICD-10-CM | POA: Diagnosis not present

## 2019-11-24 ENCOUNTER — Encounter (HOSPITAL_COMMUNITY): Payer: Self-pay | Admitting: Obstetrics & Gynecology

## 2019-11-30 DIAGNOSIS — Z20828 Contact with and (suspected) exposure to other viral communicable diseases: Secondary | ICD-10-CM | POA: Diagnosis not present

## 2019-11-30 DIAGNOSIS — Z1159 Encounter for screening for other viral diseases: Secondary | ICD-10-CM | POA: Diagnosis not present

## 2019-12-07 DIAGNOSIS — Z1159 Encounter for screening for other viral diseases: Secondary | ICD-10-CM | POA: Diagnosis not present

## 2019-12-07 DIAGNOSIS — Z20828 Contact with and (suspected) exposure to other viral communicable diseases: Secondary | ICD-10-CM | POA: Diagnosis not present

## 2019-12-14 DIAGNOSIS — Z20828 Contact with and (suspected) exposure to other viral communicable diseases: Secondary | ICD-10-CM | POA: Diagnosis not present

## 2019-12-14 DIAGNOSIS — Z1159 Encounter for screening for other viral diseases: Secondary | ICD-10-CM | POA: Diagnosis not present

## 2019-12-21 DIAGNOSIS — Z1159 Encounter for screening for other viral diseases: Secondary | ICD-10-CM | POA: Diagnosis not present

## 2019-12-21 DIAGNOSIS — Z20828 Contact with and (suspected) exposure to other viral communicable diseases: Secondary | ICD-10-CM | POA: Diagnosis not present

## 2019-12-28 DIAGNOSIS — Z1159 Encounter for screening for other viral diseases: Secondary | ICD-10-CM | POA: Diagnosis not present

## 2019-12-28 DIAGNOSIS — Z20828 Contact with and (suspected) exposure to other viral communicable diseases: Secondary | ICD-10-CM | POA: Diagnosis not present

## 2020-01-04 DIAGNOSIS — Z1159 Encounter for screening for other viral diseases: Secondary | ICD-10-CM | POA: Diagnosis not present

## 2020-01-04 DIAGNOSIS — Z20828 Contact with and (suspected) exposure to other viral communicable diseases: Secondary | ICD-10-CM | POA: Diagnosis not present

## 2020-01-11 DIAGNOSIS — Z1159 Encounter for screening for other viral diseases: Secondary | ICD-10-CM | POA: Diagnosis not present

## 2020-01-11 DIAGNOSIS — Z20828 Contact with and (suspected) exposure to other viral communicable diseases: Secondary | ICD-10-CM | POA: Diagnosis not present

## 2020-01-12 DIAGNOSIS — O4442 Low lying placenta NOS or without hemorrhage, second trimester: Secondary | ICD-10-CM | POA: Diagnosis not present

## 2020-01-12 DIAGNOSIS — Z3A26 26 weeks gestation of pregnancy: Secondary | ICD-10-CM | POA: Diagnosis not present

## 2020-01-13 DIAGNOSIS — Z3402 Encounter for supervision of normal first pregnancy, second trimester: Secondary | ICD-10-CM | POA: Diagnosis not present

## 2020-01-13 LAB — OB RESULTS CONSOLE HIV ANTIBODY (ROUTINE TESTING): HIV: NONREACTIVE

## 2020-01-18 DIAGNOSIS — Z1159 Encounter for screening for other viral diseases: Secondary | ICD-10-CM | POA: Diagnosis not present

## 2020-01-18 DIAGNOSIS — Z20828 Contact with and (suspected) exposure to other viral communicable diseases: Secondary | ICD-10-CM | POA: Diagnosis not present

## 2020-01-25 DIAGNOSIS — Z20828 Contact with and (suspected) exposure to other viral communicable diseases: Secondary | ICD-10-CM | POA: Diagnosis not present

## 2020-01-25 DIAGNOSIS — Z1159 Encounter for screening for other viral diseases: Secondary | ICD-10-CM | POA: Diagnosis not present

## 2020-02-04 DIAGNOSIS — Z20828 Contact with and (suspected) exposure to other viral communicable diseases: Secondary | ICD-10-CM | POA: Diagnosis not present

## 2020-02-04 DIAGNOSIS — Z1159 Encounter for screening for other viral diseases: Secondary | ICD-10-CM | POA: Diagnosis not present

## 2020-02-08 DIAGNOSIS — Z20828 Contact with and (suspected) exposure to other viral communicable diseases: Secondary | ICD-10-CM | POA: Diagnosis not present

## 2020-02-08 DIAGNOSIS — Z1159 Encounter for screening for other viral diseases: Secondary | ICD-10-CM | POA: Diagnosis not present

## 2020-02-09 ENCOUNTER — Other Ambulatory Visit: Payer: Self-pay

## 2020-02-09 ENCOUNTER — Encounter (HOSPITAL_COMMUNITY): Payer: Self-pay | Admitting: Emergency Medicine

## 2020-02-09 ENCOUNTER — Inpatient Hospital Stay (HOSPITAL_COMMUNITY)
Admission: EM | Admit: 2020-02-09 | Discharge: 2020-02-09 | Disposition: A | Discharge status: Home / Self Care | Payer: BC Managed Care – PPO | Attending: Obstetrics & Gynecology | Admitting: Obstetrics & Gynecology

## 2020-02-09 DIAGNOSIS — Z3A30 30 weeks gestation of pregnancy: Secondary | ICD-10-CM | POA: Insufficient documentation

## 2020-02-09 DIAGNOSIS — Z880 Allergy status to penicillin: Secondary | ICD-10-CM | POA: Insufficient documentation

## 2020-02-09 DIAGNOSIS — Y92410 Unspecified street and highway as the place of occurrence of the external cause: Secondary | ICD-10-CM | POA: Insufficient documentation

## 2020-02-09 DIAGNOSIS — O99513 Diseases of the respiratory system complicating pregnancy, third trimester: Secondary | ICD-10-CM | POA: Diagnosis not present

## 2020-02-09 DIAGNOSIS — Z79899 Other long term (current) drug therapy: Secondary | ICD-10-CM | POA: Diagnosis not present

## 2020-02-09 DIAGNOSIS — F419 Anxiety disorder, unspecified: Secondary | ICD-10-CM | POA: Insufficient documentation

## 2020-02-09 DIAGNOSIS — S60512A Abrasion of left hand, initial encounter: Secondary | ICD-10-CM | POA: Diagnosis not present

## 2020-02-09 DIAGNOSIS — J45909 Unspecified asthma, uncomplicated: Secondary | ICD-10-CM | POA: Diagnosis not present

## 2020-02-09 DIAGNOSIS — Z87442 Personal history of urinary calculi: Secondary | ICD-10-CM | POA: Insufficient documentation

## 2020-02-09 DIAGNOSIS — Z3A33 33 weeks gestation of pregnancy: Secondary | ICD-10-CM | POA: Diagnosis not present

## 2020-02-09 DIAGNOSIS — Z3A31 31 weeks gestation of pregnancy: Secondary | ICD-10-CM

## 2020-02-09 DIAGNOSIS — R519 Headache, unspecified: Secondary | ICD-10-CM | POA: Diagnosis not present

## 2020-02-09 DIAGNOSIS — O99343 Other mental disorders complicating pregnancy, third trimester: Secondary | ICD-10-CM | POA: Insufficient documentation

## 2020-02-09 DIAGNOSIS — O9A213 Injury, poisoning and certain other consequences of external causes complicating pregnancy, third trimester: Secondary | ICD-10-CM | POA: Insufficient documentation

## 2020-02-09 DIAGNOSIS — G8929 Other chronic pain: Secondary | ICD-10-CM | POA: Diagnosis not present

## 2020-02-09 HISTORY — DX: Encounter for supervision of normal pregnancy, unspecified, unspecified trimester: Z34.90

## 2020-02-09 MED ORDER — LACTATED RINGERS IV SOLN
1000.0000 mL | Freq: Once | INTRAVENOUS | Status: AC
  Administered 2020-02-09: 1000 mL via INTRAVENOUS
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
## 2020-02-09 NOTE — Progress Notes (Signed)
Pt is cleared medically from [facility].  Lizbeth Bark CNM notified of pt status.  FHM for 4 hours in place.  Pt OK to transfer to MAU for further monitoring due to critical bed status in [facility].
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
## 2020-02-09 NOTE — Progress Notes (Signed)
Pt sitting up to eat, EFM not tracing secondary maternal position.  RN will readjust EFM once pt finished eating.
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
## 2020-02-09 NOTE — Progress Notes (Signed)
Pt to MAU for continued monitoring.
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
## 2020-02-15 DIAGNOSIS — Z20828 Contact with and (suspected) exposure to other viral communicable diseases: Secondary | ICD-10-CM | POA: Diagnosis not present

## 2020-02-15 DIAGNOSIS — Z1159 Encounter for screening for other viral diseases: Secondary | ICD-10-CM | POA: Diagnosis not present

## 2020-03-03 DIAGNOSIS — Z1159 Encounter for screening for other viral diseases: Secondary | ICD-10-CM | POA: Diagnosis not present

## 2020-03-03 DIAGNOSIS — Z20828 Contact with and (suspected) exposure to other viral communicable diseases: Secondary | ICD-10-CM | POA: Diagnosis not present

## 2020-03-07 DIAGNOSIS — Z1159 Encounter for screening for other viral diseases: Secondary | ICD-10-CM | POA: Diagnosis not present

## 2020-03-07 DIAGNOSIS — Z20828 Contact with and (suspected) exposure to other viral communicable diseases: Secondary | ICD-10-CM | POA: Diagnosis not present

## 2020-03-22 DIAGNOSIS — O365999 Maternal care for other known or suspected poor fetal growth, unspecified trimester, other fetus: Secondary | ICD-10-CM | POA: Diagnosis not present

## 2020-03-22 DIAGNOSIS — Z3A36 36 weeks gestation of pregnancy: Secondary | ICD-10-CM | POA: Diagnosis not present

## 2020-03-22 DIAGNOSIS — Z369 Encounter for antenatal screening, unspecified: Secondary | ICD-10-CM | POA: Diagnosis not present

## 2020-03-22 LAB — OB RESULTS CONSOLE GBS: GBS: NEGATIVE

## 2020-03-28 DIAGNOSIS — Z1159 Encounter for screening for other viral diseases: Secondary | ICD-10-CM | POA: Diagnosis not present

## 2020-03-28 DIAGNOSIS — Z20828 Contact with and (suspected) exposure to other viral communicable diseases: Secondary | ICD-10-CM | POA: Diagnosis not present

## 2020-04-04 DIAGNOSIS — Z1159 Encounter for screening for other viral diseases: Secondary | ICD-10-CM | POA: Diagnosis not present

## 2020-04-04 DIAGNOSIS — Z20828 Contact with and (suspected) exposure to other viral communicable diseases: Secondary | ICD-10-CM | POA: Diagnosis not present

## 2020-04-09 ENCOUNTER — Inpatient Hospital Stay (HOSPITAL_COMMUNITY)
Admission: AD | Admit: 2020-04-09 | Discharge: 2020-04-11 | DRG: 807 | Disposition: A | Discharge status: Home / Self Care | Payer: BC Managed Care – PPO | Attending: Obstetrics & Gynecology | Admitting: Obstetrics & Gynecology

## 2020-04-09 ENCOUNTER — Other Ambulatory Visit: Payer: Self-pay

## 2020-04-09 ENCOUNTER — Encounter (HOSPITAL_COMMUNITY): Payer: Self-pay | Admitting: Obstetrics and Gynecology

## 2020-04-09 DIAGNOSIS — Z3A39 39 weeks gestation of pregnancy: Secondary | ICD-10-CM

## 2020-04-09 DIAGNOSIS — Z87442 Personal history of urinary calculi: Secondary | ICD-10-CM

## 2020-04-09 DIAGNOSIS — O99892 Other specified diseases and conditions complicating childbirth: Secondary | ICD-10-CM

## 2020-04-09 DIAGNOSIS — Z20822 Contact with and (suspected) exposure to covid-19: Secondary | ICD-10-CM | POA: Diagnosis present

## 2020-04-09 DIAGNOSIS — R030 Elevated blood-pressure reading, without diagnosis of hypertension: Secondary | ICD-10-CM | POA: Diagnosis present

## 2020-04-09 DIAGNOSIS — O26893 Other specified pregnancy related conditions, third trimester: Secondary | ICD-10-CM | POA: Diagnosis not present

## 2020-04-09 DIAGNOSIS — Z2839 Other underimmunization status: Secondary | ICD-10-CM

## 2020-04-09 LAB — SARS CORONAVIRUS 2 BY RT PCR (HOSPITAL ORDER, PERFORMED IN ~~LOC~~ HOSPITAL LAB): SARS Coronavirus 2: NEGATIVE

## 2020-04-09 LAB — COMPREHENSIVE METABOLIC PANEL
ALT: 26 U/L (ref 0–44)
AST: 28 U/L (ref 15–41)
Albumin: 3 g/dL — ABNORMAL LOW (ref 3.5–5.0)
Alkaline Phosphatase: 113 U/L (ref 38–126)
Anion gap: 13 (ref 5–15)
BUN: 9 mg/dL (ref 6–20)
CO2: 18 mmol/L — ABNORMAL LOW (ref 22–32)
Calcium: 9.3 mg/dL (ref 8.9–10.3)
Chloride: 105 mmol/L (ref 98–111)
Creatinine, Ser: 0.79 mg/dL (ref 0.44–1.00)
GFR calc Af Amer: >60 mL/min (ref >60)
GFR calc non Af Amer: >60 mL/min (ref >60)
Glucose, Bld: 99 mg/dL (ref 70–99)
Potassium: 4 mmol/L (ref 3.5–5.1)
Sodium: 136 mmol/L (ref 135–145)
Total Bilirubin: 0.5 mg/dL (ref 0.3–1.2)
Total Protein: 5.9 g/dL — ABNORMAL LOW (ref 6.5–8.1)

## 2020-04-09 LAB — RPR: RPR Ser Ql: NONREACTIVE

## 2020-04-09 LAB — CBC
HCT: 42 % (ref 36.0–46.0)
Hemoglobin: 14.1 g/dL (ref 12.0–15.0)
MCH: 30.9 pg (ref 26.0–34.0)
MCHC: 33.6 g/dL (ref 30.0–36.0)
MCV: 91.9 fL (ref 80.0–100.0)
Platelets: 177 10*3/uL (ref 150–400)
RBC: 4.57 MIL/uL (ref 3.87–5.11)
RDW: 14.6 % (ref 11.5–15.5)
WBC: 9 10*3/uL (ref 4.0–10.5)
nRBC: 0 % (ref 0.0–0.2)

## 2020-04-09 LAB — TYPE AND SCREEN
ABO/RH(D): O POS
Antibody Screen: NEGATIVE

## 2020-04-09 MED ORDER — SENNOSIDES-DOCUSATE SODIUM 8.6-50 MG PO TABS
2.0000 | ORAL_TABLET | ORAL | Status: DC
  Administered 2020-04-10 (×2): 2 via ORAL
  Filled 2020-04-09 (×2): qty 2

## 2020-04-09 MED ORDER — FENTANYL CITRATE (PF) 100 MCG/2ML IJ SOLN
100.0000 ug | INTRAMUSCULAR | Status: DC | PRN
  Administered 2020-04-09: 100 ug via INTRAVENOUS
  Filled 2020-04-09: qty 2

## 2020-04-09 MED ORDER — ONDANSETRON HCL 4 MG/2ML IJ SOLN: 4.0000 mg | INTRAMUSCULAR | Status: DC | PRN

## 2020-04-09 MED ORDER — LACTATED RINGERS IV SOLN: 500.0000 mL | Freq: Once | INTRAVENOUS | Status: DC

## 2020-04-09 MED ORDER — COCONUT OIL OIL: 1.0000 "application " | TOPICAL_OIL | Status: DC | PRN

## 2020-04-09 MED ORDER — MEASLES, MUMPS & RUBELLA VAC IJ SOLR: 0.5000 mL | Freq: Once | INTRAMUSCULAR | Status: DC | PRN

## 2020-04-09 MED ORDER — WITCH HAZEL-GLYCERIN EX PADS: 1.0000 "application " | MEDICATED_PAD | CUTANEOUS | Status: DC | PRN

## 2020-04-09 MED ORDER — EPHEDRINE 5 MG/ML INJ: 10.0000 mg | INTRAVENOUS | Status: DC | PRN

## 2020-04-09 MED ORDER — IBUPROFEN 600 MG PO TABS
600.0000 mg | ORAL_TABLET | Freq: Four times a day (QID) | ORAL | Status: DC
  Administered 2020-04-09 – 2020-04-11 (×8): 600 mg via ORAL
  Filled 2020-04-09 (×8): qty 1

## 2020-04-09 MED ORDER — ACETAMINOPHEN 500 MG PO TABS
1000.0000 mg | ORAL_TABLET | Freq: Four times a day (QID) | ORAL | Status: DC
  Administered 2020-04-10 (×3): 1000 mg via ORAL
  Filled 2020-04-09 (×6): qty 2

## 2020-04-09 MED ORDER — ONDANSETRON HCL 4 MG/2ML IJ SOLN: 4.0000 mg | Freq: Four times a day (QID) | INTRAMUSCULAR | Status: DC | PRN

## 2020-04-09 MED ORDER — BENZOCAINE-MENTHOL 20-0.5 % EX AERO: 1.0000 "application " | INHALATION_SPRAY | CUTANEOUS | Status: DC | PRN

## 2020-04-09 MED ORDER — ZOLPIDEM TARTRATE 5 MG PO TABS: 5.0000 mg | ORAL_TABLET | Freq: Every evening | ORAL | Status: DC | PRN

## 2020-04-09 MED ORDER — ACETAMINOPHEN 325 MG PO TABS: 650.0000 mg | ORAL_TABLET | ORAL | Status: DC | PRN

## 2020-04-09 MED ORDER — PRENATAL MULTIVITAMIN CH
1.0000 | ORAL_TABLET | Freq: Every day | ORAL | Status: DC
  Administered 2020-04-09 – 2020-04-10 (×2): 1 via ORAL
  Filled 2020-04-09 (×2): qty 1

## 2020-04-09 MED ORDER — FLEET ENEMA 7-19 GM/118ML RE ENEM: 1.0000 | ENEMA | Freq: Every day | RECTAL | Status: DC | PRN

## 2020-04-09 MED ORDER — OXYTOCIN BOLUS FROM INFUSION
333.0000 mL | Freq: Once | INTRAVENOUS | Status: AC
  Administered 2020-04-09: 333 mL via INTRAVENOUS

## 2020-04-09 MED ORDER — PHENYLEPHRINE 40 MCG/ML (10ML) SYRINGE FOR IV PUSH (FOR BLOOD PRESSURE SUPPORT): 80.0000 ug | PREFILLED_SYRINGE | INTRAVENOUS | Status: DC | PRN

## 2020-04-09 MED ORDER — SIMETHICONE 80 MG PO CHEW: 80.0000 mg | CHEWABLE_TABLET | ORAL | Status: DC | PRN

## 2020-04-09 MED ORDER — DIPHENHYDRAMINE HCL 50 MG/ML IJ SOLN: 12.5000 mg | INTRAMUSCULAR | Status: DC | PRN

## 2020-04-09 MED ORDER — BISACODYL 10 MG RE SUPP: 10.0000 mg | Freq: Every day | RECTAL | Status: DC | PRN

## 2020-04-09 MED ORDER — FENTANYL-BUPIVACAINE-NACL 0.5-0.125-0.9 MG/250ML-% EP SOLN: 12.0000 mL/h | EPIDURAL | Status: DC | PRN

## 2020-04-09 MED ORDER — SOD CITRATE-CITRIC ACID 500-334 MG/5ML PO SOLN: 30.0000 mL | ORAL | Status: DC | PRN

## 2020-04-09 MED ORDER — LACTATED RINGERS IV SOLN: INTRAVENOUS | Status: DC

## 2020-04-09 MED ORDER — DIBUCAINE (PERIANAL) 1 % EX OINT: 1.0000 "application " | TOPICAL_OINTMENT | CUTANEOUS | Status: DC | PRN

## 2020-04-09 MED ORDER — ONDANSETRON HCL 4 MG PO TABS: 4.0000 mg | ORAL_TABLET | ORAL | Status: DC | PRN

## 2020-04-09 MED ORDER — LACTATED RINGERS IV SOLN: 500.0000 mL | INTRAVENOUS | Status: DC | PRN

## 2020-04-09 MED ORDER — DIPHENHYDRAMINE HCL 25 MG PO CAPS: 25.0000 mg | ORAL_CAPSULE | Freq: Four times a day (QID) | ORAL | Status: DC | PRN

## 2020-04-09 MED ORDER — BUTALBITAL-APAP-CAFFEINE 50-325-40 MG PO TABS: 1.0000 | ORAL_TABLET | Freq: Four times a day (QID) | ORAL | Status: DC | PRN

## 2020-04-09 MED ORDER — TETANUS-DIPHTH-ACELL PERTUSSIS 5-2.5-18.5 LF-MCG/0.5 IM SUSP: 0.5000 mL | Freq: Once | INTRAMUSCULAR | Status: DC

## 2020-04-09 MED ORDER — LIDOCAINE HCL (PF) 1 % IJ SOLN: 30.0000 mL | INTRAMUSCULAR | Status: DC | PRN

## 2020-04-09 MED ORDER — OXYTOCIN-SODIUM CHLORIDE 30-0.9 UT/500ML-% IV SOLN
2.5000 [IU]/h | INTRAVENOUS | Status: DC
  Filled 2020-04-09: qty 500

## 2020-04-09 NOTE — MAU Note (Signed)
Pt reports to MAU stating about 1 hour ago she had a gush of pinkish fluid. Pt reports ctx every 4 min. +FM.

## 2020-04-09 NOTE — Lactation Note (Signed)
This note was copied from a baby's chart. Lactation Consultation Note  Patient Name: Pamela Carpenter [id number] Date: 04/09/2020 Reason for consult: Initial assessment;Term;Infant < 6lbs  Visited with mom of a 57 hours old FT female < 6 lbs; mom is a P2. She BF her first child for 2 months and the BF difficulty she had to face back then was that she had inverted nipples; that was 11 years ago. LC checked on mom's nipples and they are no longer inverted, neither invert upon compression when doing hand expression, but they're flat; tissue is compressible. Colostrum noted, praised her for her efforts.  Offered assistance with latch but mom told LC that baby just had her bath. Mom still tried but baby was very sleepy and would not even open her mouth, mom continued doing STS, an attempt was documented in Flowsheets. Reviewed normal newborn behavior, feeding diary, cluster feeding and supplementation guidelines for LPIs due to baby's birth weight. Mom understands that her baby is not an LPI but due to birth weight, she agreed to start pumping today.  LC set up a DEBP, instructions, cleaning and storage were reviewed as well as milk storage guidelines. Mom has a Medela DEBP at home also.   Feeding plan:  1. Encouraged mom to feed baby STS 8-12 times/24 hours or sooner if feeding cues are present 2. Mom will try pumping every 3 hours after feedings, and will offer any amount of EBM she may get 3. Feeding plan will be revised again tomorrow to determine need for further supplementation 4. Baby still does not have a LATCH score at 13 hours old, mom was asked to call her RN/LC for latch assistance to document LATCH score when baby is ready  BF brochure, BF resources, LPI handout and feeding diary were reviewed. Dad present and supportive. Parents reported all questions and concerns were answered, they're both aware of LC OP services and will call PRN.   Maternal Data Formula Feeding for Exclusion:  No Has patient been taught Hand Expression?: Yes Does the patient have breastfeeding experience prior to this delivery?: Yes  Feeding Feeding Type: Breast Fed  LATCH Score                   Interventions Interventions: Breast feeding basics reviewed;Assisted with latch;Skin to skin;Breast massage;Hand express;Breast compression;DEBP  Lactation Tools Discussed/Used Tools: Pump Breast pump type: Double-Electric Breast Pump WIC Program: No Pump Review: Setup, frequency, and cleaning;Milk Storage Initiated by:: MPeck Date initiated:: 04/09/20   Consult Status Consult Status: Follow-up Date: 04/10/20 Follow-up type: In-patient    Hodge Stachnik Venetia Constable 04/09/2020, 7:26 PM

## 2020-04-09 NOTE — MAU Provider Note (Signed)
°  S: Pamela Carpenter is a 29 y.o. G2P1001 at [redacted]w[redacted]d  who presents to MAU today complaining contractions q 4 minutes since 1 hour when she felt a gush of pinkish fluid. She denies vaginal bleeding. She endorses LOF. She reports normal fetal movement.    O: BP 132/76    Pulse 74    Temp 98.1 F (36.7 C) (Oral)    Resp 18    LMP 07/09/2019 Comment: had IUD removed Sept   SpO2 98%  GENERAL: Well-developed, well-nourished female in no acute distress.  HEAD: Normocephalic, atraumatic.  CHEST: Normal effort of breathing, regular heart rate ABDOMEN: Soft, nontender, gravid PELVIC: Sterile speculum exam shows pooling, positive cough test  Cervical exam:  Dilation: 3 Effacement (%): 80 Station: -2 Presentation: Vertex Exam by:: Lauren Cox RN    Fetal Monitoring: Baseline: 135 Variability: moderate Accelerations: 15x15 Decelerations: none Contractions: q3-4 minutes   A: SIUP at [redacted]w[redacted]d  SROM with clear fluid  P: Report called to Arlan Organ, CNM and Dr. Dion Body Admission orders placed for L&D  Marlowe Alt, DO 04/09/2020 3:45 AM

## 2020-04-09 NOTE — Progress Notes (Signed)
Pamela Carpenter is a 29 y.o. G2P1001 at [redacted]w[redacted]d by ultrasound admitted for SROM, early labor  Subjective:  Resting in bed, working w/ ctx Denies HA/NV/RUQ pain/visual changes.   Feeling rectal pressure w/ ctx.   Objective: Vitals:   04/09/20 0220 04/09/20 0230 04/09/20 0324 04/09/20 0330  BP: 137/90 (!) 140/93 132/76   Pulse: 80 91 74   Resp:   18   Temp:    98.1 F (36.7 C)  TempSrc:    Oral  SpO2: 98%         FHT:  FHR: 125 bpm, variability: moderate,  accelerations:  Present,  decelerations:  Absent UC:   regular, every 2-3 minutes SVE:   Dilation: 7.5 Effacement (%): 90 Station: -2 Exam by:: Oncologist B RN  Labs:   Recent Labs    04/09/20 0250  WBC 9.0  HGB 14.1  HCT 42.0  PLT 177   CMP Latest Ref Rng & Units 04/09/2020 08/25/2019 10/09/2017  Glucose 70 - 99 mg/dL 99 91 88  BUN 6 - 20 mg/dL 9 8 12   Creatinine 0.44 - 1.00 mg/dL 7.61 9.50  Sodium 135 - 145 mmol/L 136 136 141  Potassium 3.5 - 5.1 mmol/L 4.0 3.7 4.4  Chloride 98 - 111 mmol/L 105 103 103  CO2 22 - 32 mmol/L 18(L) 22 21  Calcium 8.9 - 10.3 mg/dL 9.3 9.5 9.8  Total Protein 6.5 - 8.1 g/dL 5.9(L) 7.3 7.6  Total Bilirubin 0.3 - 1.2 mg/dL 0.5 0.4 0.9  Alkaline Phos 38 - 126 U/L 113 35(L) 39  AST 15 - 41 U/L 28 17 10   ALT 0 - 44 U/L 26 15 8      Assessment / Plan: G2P1001 29 y.o. [redacted]w[redacted]d Spontaneous labor, progressing normally  Labor: Progressing normally Preeclampsia:  no signs or symptoms of toxicity and labs stable, BP labile to mild range Fetal Wellbeing:  Category I Pain Control:  Labor support without medications I/D:  GBS neg Anticipated MOD:  NSVD  , CNM, MSN 04/09/2020, 5:34 AM

## 2020-04-09 NOTE — H&P (Signed)
OB ADMISSION/ HISTORY & PHYSICAL:  Admission Date: 04/09/2020  1:51 AM  Admit Diagnosis: Normal labor [O80, Z37.9]    Pamela Carpenter is a 29 y.o. female presenting for LOF and contractions. MAU assessment + SROM, 3 cm. Denies HA/NV/RUQ pain/visual changes.   Desires unmedicated birth.  Hx epidural with first labor, pelvis proven to 7 lbs  Prenatal History: G2P1001   EDC : 04/14/2020, by Last Menstrual Period  Prenatal care at [facility] since 10 wks   Prenatal course complicated by: 1. Hx migraines on Fioricet 2. Hx kidney stones 3. Rubella non-immune  Prenatal Labs: ABO, Rh:   O pos Antibody: NEG (08/21 0246) Rubella:   non-immune RPR:   NR HBsAg:   neg HIV:   neg GBS:   neg 1 hr Glucola : 79 Genetic Screening: Panorama LR XX, Horizon neg Ultrasound: normal XX anatomy, posterior placenta TdaP UTD    Maternal Diabetes: No Genetic Screening: Normal Maternal Ultrasounds/Referrals: Normal Fetal Ultrasounds or other Referrals:  None Maternal Substance Abuse:  No Significant Maternal Medications:  None Significant Maternal Lab Results:  Group B Strep negative Other Comments:  None  Medical / Surgical History :  Past medical history:  Past Medical History:  Diagnosis Date  . Asthma    exercised induced  . Breast mass   . BV (bacterial vaginosis)   . Kidney stones   . Pregnant   . Yeast infection      Past surgical history:  Past Surgical History:  Procedure Laterality Date  . WISDOM TOOTH EXTRACTION       Family History:  Family History  Problem Relation Age of Onset  . Hypertension Father   . Kidney disease Father   . Diabetes Father   . Diabetes Maternal Grandmother   . Diabetes Maternal Grandfather   . Heart disease Maternal Grandfather      Social History:  reports that she has never smoked. She has never used smokeless tobacco. She reports previous alcohol use. She reports that she does not use drugs.   Allergies: Amoxicillin    Current Medications at time of admission:  Medications Prior to Admission  Medication Sig Dispense Refill Last Dose  . Prenatal Vit-Fe Fumarate-FA (PRENATAL MULTIVITAMIN) TABS tablet Take 1 tablet by mouth daily at 12 noon.   04/08/2020 at Unknown time  . ALPRAZolam (XANAX) 0.25 MG tablet Take 1-2 tablets (0.25-0.5 mg total) by mouth at bedtime as needed for anxiety. (Patient not taking: Reported on 02/09/2020) 30 tablet 0   . butalbital-acetaminophen-caffeine (FIORICET) 50-325-40 MG tablet Take 1 tablet by mouth as needed.     . ondansetron (ZOFRAN-ODT) 4 MG disintegrating tablet Take 1-2 tablets (4-8 mg total) by mouth every 8 (eight) hours as needed for nausea or vomiting. (Patient not taking: Reported on 02/09/2020) 30 tablet 0   . triamcinolone cream (KENALOG) 0.1 % Apply 1 application topically 2 (two) times daily. (Patient taking differently: Apply 1 application topically as needed (flare up). ) 30 g 1      Review of Systems: ROS As noted above Physical Exam: Vital signs and nursing notes reviewed.  Patient Vitals for the past 24 hrs:  BP Temp Temp src Pulse Resp SpO2  04/09/20 0330 -- 98.1 F (36.7 C) Oral -- -- --  04/09/20 0324 132/76 -- -- 74 18 --  04/09/20 0230 (!) 140/93 -- -- 91 -- --  04/09/20 0220 137/90 -- -- 80 -- 98 %  04/09/20 0215 (!) 152/103 -- -- 90 -- 96 %  04/09/20 0210 (!) 156/94 (!) 97.2 F (36.2 C) Axillary 82 -- 98 %     General: AAO x 3, NAD, coping well Heart: RRR Lungs:CTAB Abdomen: Gravid, NT, Leopold's vertex, fetal spine to maternal R Extremities: no edema Genitalia / VE: Dilation: 3 Effacement (%): 80 Station: -2 Presentation: Vertex Exam by:: Lauren Cox RN    FHR: 130 BPM, nod variability, + accels, no decels TOCO: Ctx q2-3 min  Labs:   Pending T&S, RPR, CMP, PCR Recent Labs    04/09/20 0250  WBC 9.0  HGB 14.1  HCT 42.0  PLT 177       Assessment:  29 y.o. G2P1001 at [redacted]w[redacted]d Elevated BP, no neural sx, no hx HTN/PEC  1.  Early stage of labor, SROM 2. FHR category 1 3. GBS neg 4. Desires unmedicated birth 5. Breastfeeding 6. Placenta disposal per patient request  Plan:  1. Admit to BS 2. Routine L&D orders, add PEC labs 3. Analgesia/anesthesia PRN  4. Expectant management 5. Anticipate NSVB   Dr Dion Body notified of admission / plan of care   Neta Mends CNM, MSN 04/09/2020, 3:38 AM

## 2020-04-10 LAB — CBC
HCT: 35.8 % — ABNORMAL LOW (ref 36.0–46.0)
Hemoglobin: 12 g/dL (ref 12.0–15.0)
MCH: 31.2 pg (ref 26.0–34.0)
MCHC: 33.5 g/dL (ref 30.0–36.0)
MCV: 93 fL (ref 80.0–100.0)
Platelets: 165 10*3/uL (ref 150–400)
RBC: 3.85 MIL/uL — ABNORMAL LOW (ref 3.87–5.11)
RDW: 14.7 % (ref 11.5–15.5)
WBC: 10.6 10*3/uL — ABNORMAL HIGH (ref 4.0–10.5)
nRBC: 0 % (ref 0.0–0.2)

## 2020-04-10 NOTE — Lactation Note (Signed)
This note was copied from a baby's chart. Lactation Consultation Note  Patient Name: Pamela Carpenter [id number] Date: 04/10/2020  Infant had 2 voids and 4 stools. Per mom, she feels infant is latching well, she has no questions or concerns for LC. P2, 35 hour female term infant, less than 6 lbs, weight loss -3%. Per mom, infant been cluster feeding in past 24 hours so she has not been using DEBP as previously advised by LC earlier. Per mom,  infant been breastfeeding every 21/2 to 3 hours for 15 minutes most feedings. Infant was asleep in basinet, per mom, infant was  breastfeed less than 2 hours before LC walked in the room. LC did not assess latch at this time. LC encouraged mom to continue to BF according to cues, on demand, 8 to 12+ times and use DEBP . Per mom, she mostly been latching infant at the breast and LC praised mom for her efforts,  mom is willing to hand express after latching infant at breast and give  Infant back extra volume. LC explained the extra volume of EBM from hand expression with help with stabilizing infant's weight since  Infant is less than 6 lbs and establish her milk supply , mom choices is not to use DEBP at this time but she will hand express. Per mom, she informed LC she had 3 mls of EBM in fridge in a bullet and she will offer it  at next feeding after latching infant at breast. Mom knows to call LC if she has questions, concerns or needs any assistance with latching infant at breast.    Maternal Data    Feeding Feeding Type: Breast Fed  LATCH Score                   Interventions    Lactation Tools Discussed/Used     Consult Status      Danelle Earthly 04/10/2020, 5:17 PM

## 2020-04-10 NOTE — Progress Notes (Addendum)
Subjective: Postpartum Day # 1 : S/P NSVD due to Latent labor with Mec SROM, pt progressed with no Augmentin had SVD on 8/21, over intact perineum, ebl hgb drop from 14.1-12, stable. Had elevated BP upon admission but did not meet GHTN criteria, no dx, no meds, normotensive since, unremarkable cbc and cmp, denies HA, RUQ pain or vision changes. Patient up ad lib, denies syncope or dizziness. Reports consuming regular diet without issues and denies N/V. Patient reports 0 bowel movement + passing flatus.  Denies issues with urination and reports bleeding is "lighter."  Patient is breastfeeding and reports going well.  Desires IUD for postpartum contraception.  Pain is being appropriately managed with use of po meds. Declined early discharge today.   Intact laceration Feeding:  breast Contraceptive plan:  IUD Baby female.   Objective: Vital signs in last 24 hours: Patient Vitals for the past 24 hrs:  BP Temp Temp src Pulse Resp SpO2  04/10/20 0515 120/74 98.2 F (36.8 C) Oral 62 17 100 %  04/09/20 2101 134/82 98.1 F (36.7 C) Oral 79 16 100 %  04/09/20 1828 114/76 98.3 F (36.8 C) Oral 72 18 100 %  04/09/20 1410 113/75 98.2 F (36.8 C) Oral 67 18 99 %  04/09/20 0900 110/80 98.2 F (36.8 C) Oral 76 18 100 %     Physical Exam:  General: alert, cooperative, appears stated age and no distress Mood/Affect: Happy Lungs: clear to auscultation, no wheezes, rales or rhonchi, symmetric air entry.  Heart: normal rate, regular rhythm, normal S1, S2, no murmurs, rubs, clicks or gallops. Breast: breasts appear normal, no suspicious masses, no skin or nipple changes or axillary nodes. Abdomen:  + bowel sounds, soft, non-tender GU: perineum Intact, healing well. No signs of external hematomas.  Uterine Fundus: firm Lochia: appropriate Skin: Warm, Dry. DVT Evaluation: No evidence of DVT seen on physical exam. Negative Homan's sign. No cords or calf tenderness.  CBC Latest Ref Rng & Units  04/10/2020 04/09/2020 08/25/2019  WBC 4.0 - 10.5 K/uL 10.6(H) 9.0 11.3(H)  Hemoglobin 12.0 - 15.0 g/dL 21.1 94.1 74.0  Hematocrit 36 - 46 % 35.8(L) 42.0 38.9  Platelets 150 - 400 K/uL 165 177 267    Results for orders placed or performed during the hospital encounter of 04/09/20 (from the past 24 hour(s))  CBC     Status: Abnormal   Collection Time: 04/10/20  4:24 AM  Result Value Ref Range   WBC 10.6 (H) 4.0 - 10.5 K/uL   RBC 3.85 (L) 3.87 - 5.11 MIL/uL   Hemoglobin 12.0 12.0 - 15.0 g/dL   HCT 81.4 (L) 36 - 46 %   MCV 93.0 80.0 - 100.0 fL   MCH 31.2 26.0 - 34.0 pg   MCHC 33.5 30.0 - 36.0 g/dL   RDW 48.1 85.6 - 31.4 %   Platelets 165 150 - 400 K/uL   nRBC 0.0 0.0 - 0.2 %     CBG (last 3)  No results for input(s): GLUCAP in the last 72 hours.   I/O last 3 completed shifts: In: -  Out: 100 [Blood:100]   Assessment Postpartum Day # 1 : S/P NSVD due to Latent labor with Mec SROM, pt progressed with no Augmentin had SVD on 8/21, over intact perineum, ebl hgb drop from 14.1-12, stable. Had elevated BP upon admission but did not meet GHTN criteria, no dx, no meds, normotensive since, unremarkable cbc and cmp, denies HA, RUQ pain or vision changes -1 involution.  Breastfeeding. Hemodynamically stable. Declined early discharge. Rubella Non-Immune.   Plan: Continue other mgmt as ordered Rubella Non-Immune: Declined vaccine in pt.  VTE prophylactics: Early ambulated as tolerates.  Pain control: Motrin/Tylenol PRN Education given regarding options for contraception, including IUD placement.  Plan for discharge tomorrow, Breastfeeding, Lactation consult and Contraception IUD   Dr. Dion Body to be updated on patient status  [facility] NP-C, CNM 04/10/2020, 8:08 AM

## 2020-04-11 ENCOUNTER — Inpatient Hospital Stay: Payer: BC Managed Care – PPO

## 2020-04-11 DIAGNOSIS — Z23 Encounter for immunization: Secondary | ICD-10-CM

## 2020-04-11 MED ORDER — ACETAMINOPHEN 500 MG PO TABS: 1000.0000 mg | ORAL_TABLET | Freq: Four times a day (QID) | ORAL | 0 refills | Status: AC

## 2020-04-11 MED ORDER — IBUPROFEN 600 MG PO TABS: 600.0000 mg | ORAL_TABLET | Freq: Four times a day (QID) | ORAL | 0 refills | Status: AC

## 2020-04-11 NOTE — Progress Notes (Signed)
   Covid-19 Vaccination Clinic  Name:  Pamela Carpenter    MRN: [id number] DOB: 06/20/1991  04/11/2020  Pamela Carpenter was observed post Covid-19 immunization for 15 minutes without incident. She was provided with Vaccine Information Sheet and instruction to access the V-Safe system.   Pamela Carpenter was instructed to call 911 with any severe reactions post vaccine: Marland Kitchen Difficulty breathing  . Swelling of face and throat  . A fast heartbeat  . A bad rash all over body  . Dizziness and weakness   Immunizations Administered    Name Date Dose VIS Date Route   Pfizer COVID-19 Vaccine 04/11/2020 11:00 AM 0.3 mL 10/14/2018 Intramuscular   Manufacturer: [employer], [employer]   Lot: [id number]   NDC: [address]-9

## 2020-04-11 NOTE — Discharge Summary (Signed)
Postpartum Discharge Summary  Date of Service updated 04/11/20     Patient Name: Pamela Carpenter DOB: Jan 05, 1991 MRN: [id number]  Date of admission: 04/09/2020 Delivery date:04/09/2020  Delivering provider: Juliene Pina  Date of discharge: 04/11/2020  Admitting diagnosis: Normal labor [O80, Z37.9] Intrauterine pregnancy: [redacted]w[redacted]d    Secondary diagnosis:  Principal Problem:   Postpartum care following vaginal delivery 8/21 Active Problems:   SVD (spontaneous vaginal delivery)   Rubella nonimmune status, delivered, current hospitalization  Additional problems:  Patient Active Problem List   Diagnosis Date Noted  . SVD (spontaneous vaginal delivery) 04/09/2020  . Postpartum care following vaginal delivery 8/21 04/09/2020  . Rubella nonimmune status, delivered, current hospitalization 04/09/2020  . Asthma 02/09/2020  . Chronic headache disorder 02/09/2020  . Personal history of kidney stones 02/09/2020       Discharge diagnosis: Term Pregnancy Delivered                                              Post partum procedures: none Augmentation: none Complications: None  Hospital course: Onset of Labor With Vaginal Delivery      29y.o. yo [id number] 365w2das admitted in Active Labor on 04/09/2020. Patient had an uncomplicated labor course as follows:  Membrane Rupture Time/Date:  ,   Delivery Method:Vaginal, Spontaneous  Episiotomy: None  Lacerations:  None  Patient had an uncomplicated postpartum course.  She is ambulating, tolerating a regular diet, passing flatus, and urinating well. Patient is discharged home in stable condition on 04/11/20.  Newborn Data: Birth date:04/09/2020  Birth time:5:47 AM  Gender:Female  Living status:Living  Apgars:9 ,9  [phone]   Magnesium Sulfate received: No BMZ received: No Rhophylac:N/A MMR:N/A T-DaP:Given prenatally Flu: Yes Transfusion:No  Physical exam  Vitals:   04/09/20 2101 04/10/20 0515 04/10/20 1917 04/11/20 0502   BP: 134/82 120/74 126/78 117/74  Pulse: 79 62 76 66  Resp: '16 17 16 18  ' Temp: 98.1 F (36.7 C) 98.2 F (36.8 C) 98 F (36.7 C) 98.1 F (36.7 C)  TempSrc: Oral Oral Oral Oral  SpO2: 100% 100% 100% 100%   General: alert, cooperative and no distress Lochia: appropriate Uterine Fundus: firm Incision: N/A DVT Evaluation: No evidence of DVT seen on physical exam. No cords or calf tenderness. No significant calf/ankle edema. Labs: Lab Results  Component Value Date   WBC 10.6 (H) 04/10/2020   HGB 12.0 04/10/2020   HCT 35.8 (L) 04/10/2020   MCV 93.0 04/10/2020   PLT 165 04/10/2020   CMP Latest Ref Rng & Units 04/09/2020  Glucose 70 - 99 mg/dL 99  BUN 6 - 20 mg/dL 9  Creatinine 0.44 - 1.00 mg/dL 0.79  Sodium 135 - 145 mmol/L 136  Potassium 3.5 - 5.1 mmol/L 4.0  Chloride 98 - 111 mmol/L 105  CO2 22 - 32 mmol/L 18(L)  Calcium 8.9 - 10.3 mg/dL 9.3  Total Protein 6.5 - 8.1 g/dL 5.9(L)  Total Bilirubin 0.3 - 1.2 mg/dL 0.5  Alkaline Phos 38 - 126 U/L 113  AST 15 - 41 U/L 28  ALT 0 - 44 U/L 26   Edinburgh Score: Edinburgh Postnatal Depression Scale Screening Tool 04/10/2020  I have been able to laugh and see the funny side of things. 0  I have looked forward with enjoyment to things. 0  I have blamed myself unnecessarily when  things went wrong. 0  I have been anxious or worried for no good reason. 0  I have felt scared or panicky for no good reason. 0  Things have been getting on top of me. 0  I have been so unhappy that I have had difficulty sleeping. 0  I have felt sad or miserable. 0  I have been so unhappy that I have been crying. 0  The thought of harming myself has occurred to me. 0  Edinburgh Postnatal Depression Scale Total 0      After visit meds:  Allergies as of 04/11/2020       Reactions   Amoxicillin Hives        Medication List     TAKE these medications    acetaminophen 500 MG tablet Commonly known as: TYLENOL Take 2 tablets (1,000 mg total) by  mouth every 6 (six) hours.   butalbital-acetaminophen-caffeine 50-325-40 MG tablet Commonly known as: FIORICET Take 1 tablet by mouth as needed for migraine.   ibuprofen 600 MG tablet Commonly known as: ADVIL Take 1 tablet (600 mg total) by mouth every 6 (six) hours.   prenatal multivitamin Tabs tablet Take 1 tablet by mouth daily at 12 noon.   triamcinolone cream 0.1 % Commonly known as: KENALOG Apply 1 application topically 2 (two) times daily. What changed:   when to take this  reasons to take this         Discharge home in stable condition Infant Feeding: Bottle Infant Disposition:home with mother Discharge instruction: per After Visit Summary and Postpartum booklet. Activity: Advance as tolerated. Pelvic rest for 6 weeks.  Diet: low salt diet Anticipated Birth Control: Unsure Postpartum Appointment:6 weeks Additional Postpartum F/U: none Future Appointments:No future appointments. Follow up Visit:  [facility]. Schedule an appointment as soon as possible for a visit in 6 week(s).   Specialty: Obstetrics and Gynecology Contact information: [address] [place] [address] [phone]                    04/11/2020 Arrie Eastern, CNM

## 2020-04-11 NOTE — Lactation Note (Signed)
This note was copied from a baby's chart. Lactation Consultation Note  Patient Name: Pamela Carpenter OMBTD'H Date: 04/11/2020 Reason for consult: Follow-up assessment;Infant < 6lbs   Mother is exclusively breastfeeding , infant has a wt gain overnight.  Mother reports that she is hearing infant swallow.  Mother denies having any questions or concerns for the [facility]  Reviewed cluster feeding and supplementing ebm after feeding.   Discussed treatment and prevention of engorgement.  Mother to continue to cue base feed infant and feed at least 8-12 times or more in 24 hours and advised to allow for cluster feeding infant as needed.   Mother to continue to due STS. Mother is aware of available LC services at [facility], BFSG'S, OP Dept, and phone # for questions or concerns about breastfeeding.  Mother receptive to all teaching and plan of care.      Maternal Data    Feeding    LATCH Score                   Interventions    Lactation Tools Discussed/Used     Consult Status Consult Status: Complete    Michel Bickers 04/11/2020, 12:03 PM

## 2020-05-24 DIAGNOSIS — Z3040 Encounter for surveillance of contraceptives, unspecified: Secondary | ICD-10-CM | POA: Diagnosis not present

## 2020-06-13 DIAGNOSIS — Z20828 Contact with and (suspected) exposure to other viral communicable diseases: Secondary | ICD-10-CM | POA: Diagnosis not present

## 2020-06-13 DIAGNOSIS — Z1159 Encounter for screening for other viral diseases: Secondary | ICD-10-CM | POA: Diagnosis not present

## 2020-08-11 LAB — HM PAP SMEAR

## 2020-08-11 LAB — RESULTS CONSOLE HPV: CHL HPV: NEGATIVE

## 2020-09-12 IMAGING — US US MFM OB COMP +14 WKS
1 series · 13 of 28 positions shown · non-contrast
Comparison: none

[Series 1: us mfm ob comp +14 wks · 13 of 95 slices shown]
[im 4/95]
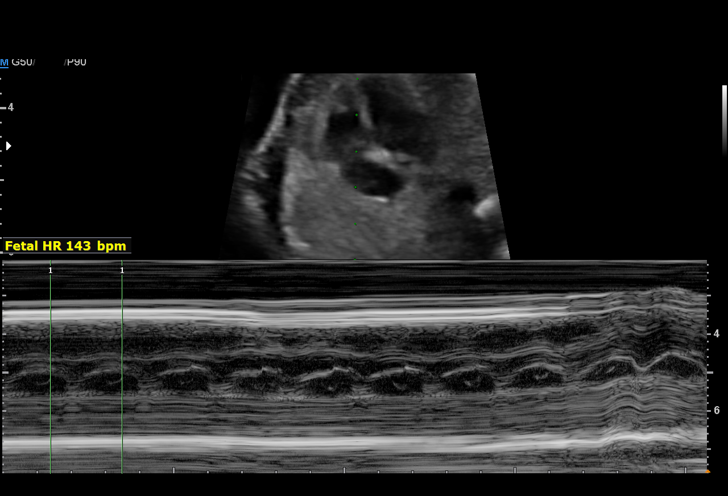
[im 11/95]
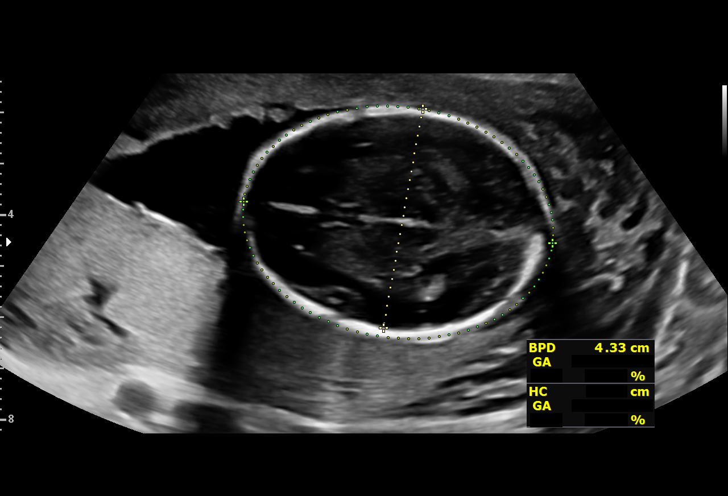
[im 18/95]
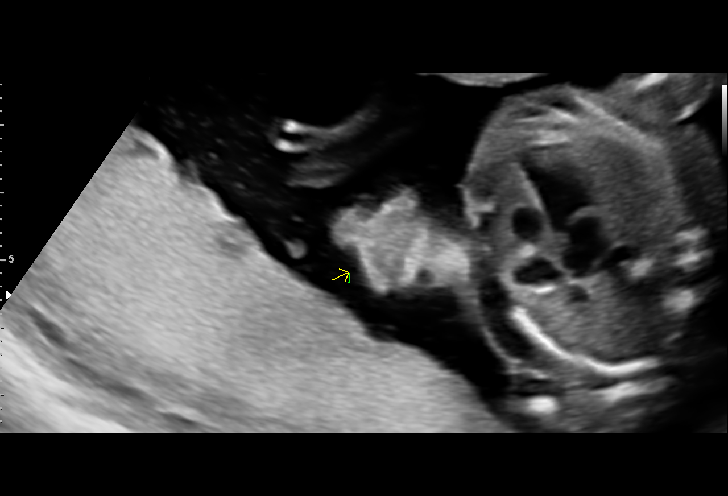
[im 25/95]
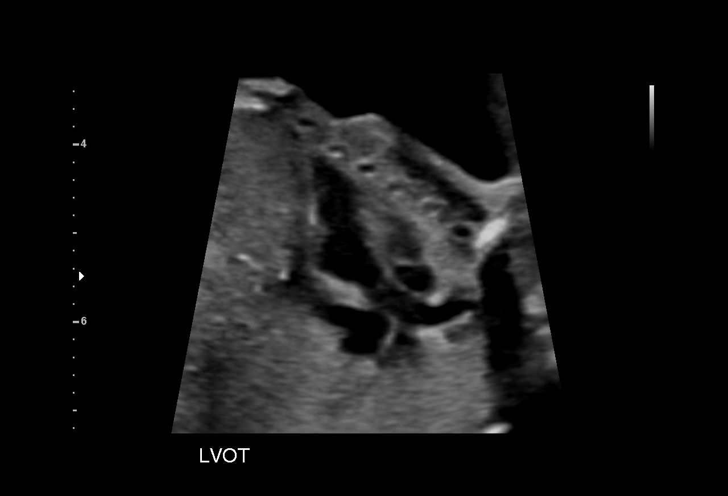
[im 32/95]
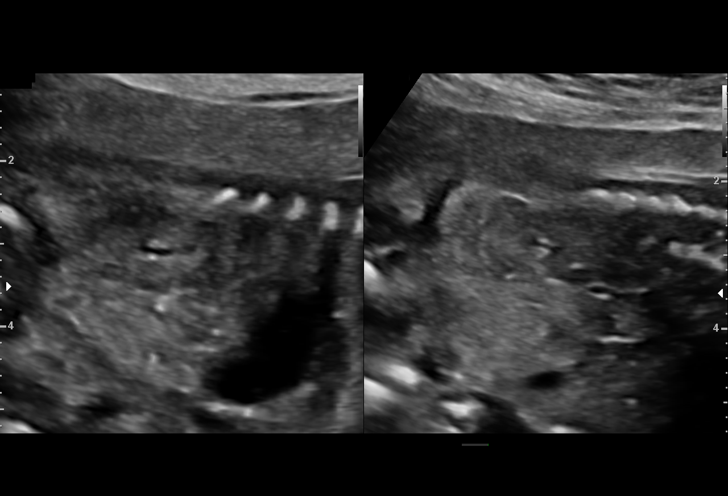
[im 39/95]
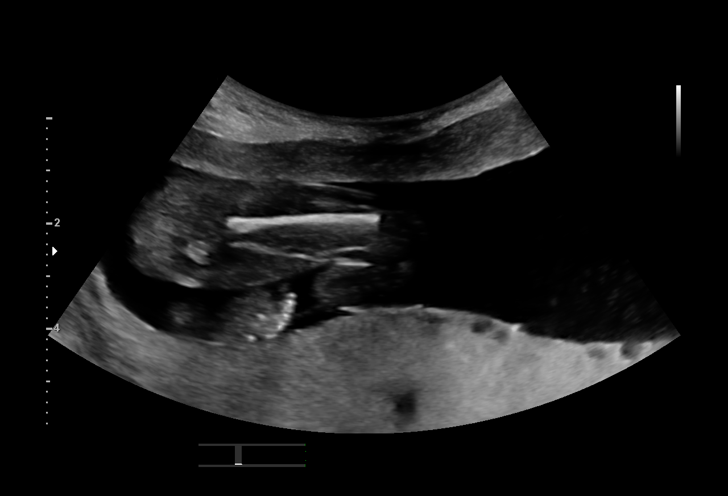
[im 49/95]
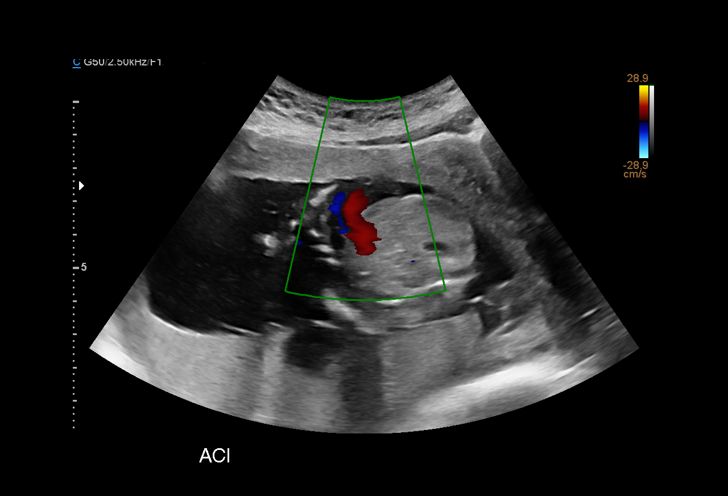
[im 56/95]
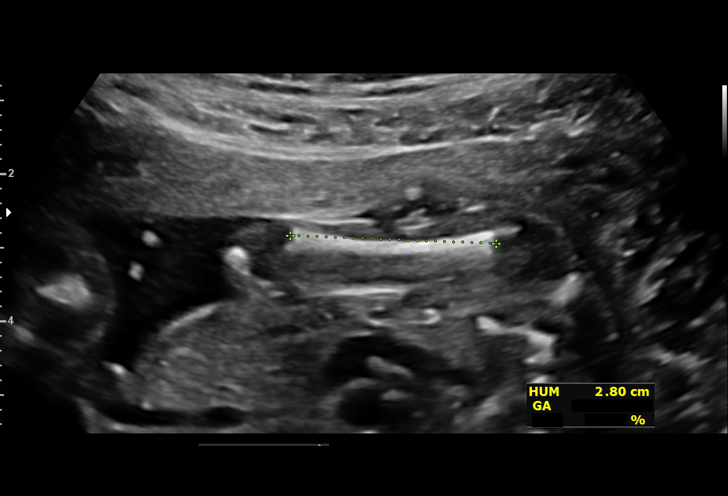
[im 63/95]
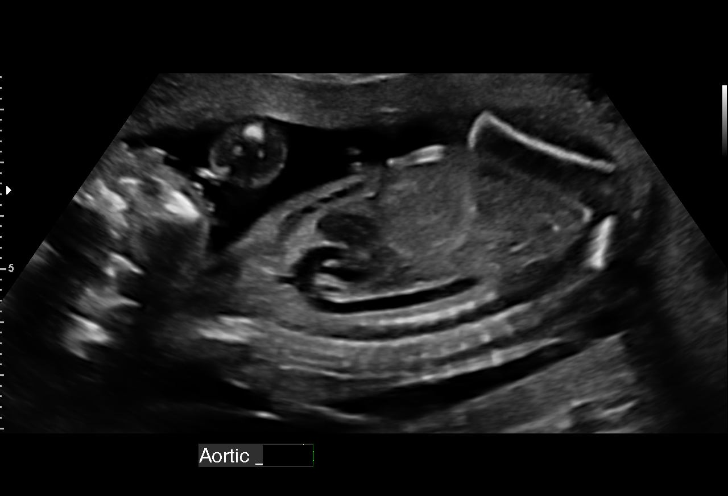
[im 70/95]
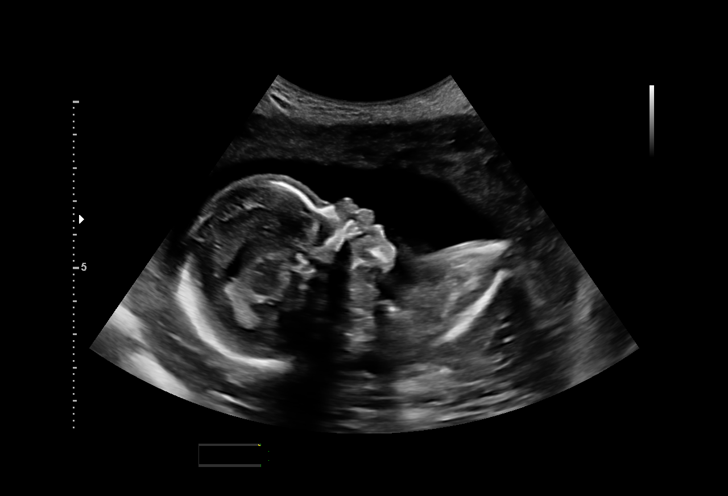
[im 77/95]
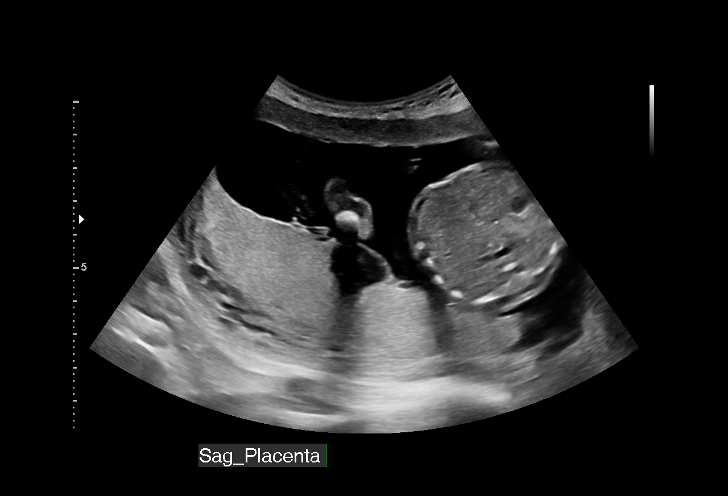
[im 84/95]
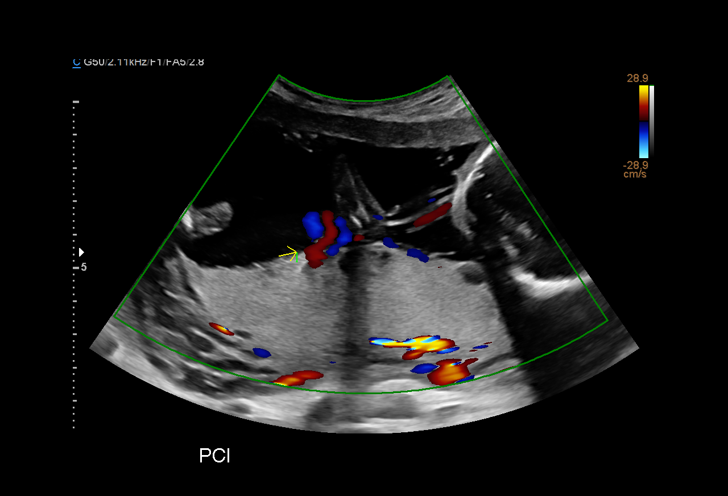
[im 91/95]
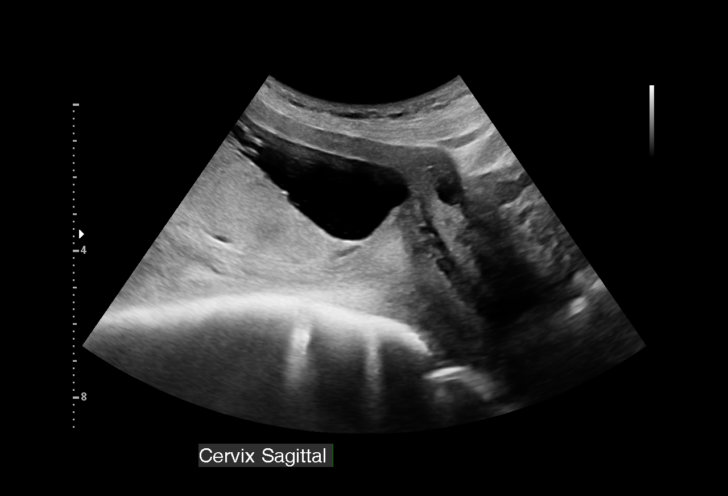

[13 of 28 positions shown; findings below may reference images not displayed]

Obstetrics &
                                                            Gynecology
                                                            [address]
                                                            [place].
                   CNM

  1  US MFM OB COMP + 14 WK               [address].01     [place]
 ----------------------------------------------------------------------

 ----------------------------------------------------------------------
Indications

  Encounter for antenatal screening for
  malformations
  19 weeks gestation of pregnancy
 ----------------------------------------------------------------------
Fetal Evaluation

 Num Of Fetuses:         1
 Fetal Heart Rate(bpm):  143
 Cardiac Activity:       Observed
 Presentation:           Breech
 Placenta:               Posterior, low-lying, 1.7 cm from int os
 P. Cord Insertion:      Visualized

 Amniotic Fluid
 AFI FV:      Within normal limits

                             Largest Pocket(cm)

Biometry

 BPD:      43.3  mm     G. Age:  19w 1d         30  %    CI:        68.38   %    70 - 86
                                                         FL/HC:      17.3   %    16.8 -
 HC:      167.4  mm     G. Age:  19w 3d         34  %    HC/AC:      1.13        1.09 -
 AC:      148.6  mm     G. Age:  20w 1d         64  %    FL/BPD:     67.0   %
 FL:         29  mm     G. Age:  18w 6d         21  %    FL/AC:      19.5   %    20 - 24
 HUM:      27.6  mm     G. Age:  18w 6d         33  %
 CER:      19.8  mm     G. Age:  18w 6d         36  %
 NFT:       3.7  mm
 LV:        5.9  mm
 CM:        2.9  mm

 Est. FW:     298  gm    0 lb 11 oz      43  %
OB History

 Gravidity:    2         Term:   1
 Living:       1
Gestational Age

 LMP:           19w 4d        Date:  07/09/19                 EDD:   04/14/20
 U/S Today:     19w 3d                                        EDD:   04/15/20
 Best:          19w 4d     Det. By:  LMP  (07/09/19)          EDD:   04/14/20
Anatomy

 Cranium:               Appears normal         LVOT:                   Appears normal
 Cavum:                 Appears normal         Aortic Arch:            Appears normal
 Ventricles:            Appears normal         Ductal Arch:            Appears normal
 Choroid Plexus:        Appears normal         Diaphragm:              Appears normal
 Cerebellum:            Appears normal         Stomach:                Appears normal, left
                                                                       sided
 Posterior Fossa:       Appears normal         Abdomen:                Appears normal
 Nuchal Fold:           Appears normal         Abdominal Wall:         Appears nml (cord
                                                                       insert, abd wall)
 Face:                  Appears normal         Cord Vessels:           Appears normal (3
                        (orbits and profile)                           vessel cord)
 Lips:                  Appears normal         Kidneys:                Appear normal
 Palate:                Not well visualized    Bladder:                Appears normal
 Thoracic:              Appears normal         Spine:                  Ltd views no
                                                                       intracranial signs of
                                                                       NTD
 Heart:                 Appears normal         Upper Extremities:      Appears normal
                        (4CH, axis, and
                        situs)
 RVOT:                  Appears normal         Lower Extremities:      Appears normal

 Other:  Female gender Heels and 5th digit visualized. Open hands visualized.
         Nasal bone visualized. Technically difficult due to fetal position.
Cervix Uterus Adnexa

 Cervix
 Length:           5.96  cm.
 Normal appearance by transabdominal scan.

 Uterus
 No abnormality visualized.

 Left Ovary
 Size(cm)       3.4  x   1.9    x  2         Vol(ml):
 Within normal limits.

 Right Ovary
 Size(cm)       2.8  x   1.9    x  1.8       Vol(ml):
 Within normal limits.
Impression

 G2 P1.  Patient is here for fetal anatomy scan.

 On cell-free fetal DNA screening, the risks of fetal
 aneuploidies are not increased.

 Obstetric history is significant for a term vaginal delivery.

 We performed fetal anatomy scan. No makers of
 aneuploidies or fetal structural defects are seen. Fetal
 biometry is consistent with her previously-established dates.
 Amniotic fluid is normal and good fetal activity is seen.
 Patient understands the limitations of ultrasound in detecting
 fetal anomalies.
 Placenta is low-lying but there is no evidence of previa.
 reassured that low-lying placenta resolves with advancing
 gestation.

 I recommend a follow-up ultrasound at 28 weeks' gestation to
 assess placental position. Patient would like to have follow-up
 ultrasound at your office.
Recommendations

 -Follow-up scan at 28 weeks to check for resolution of low-
 lying placenta.
                 Basu, Norma

## 2020-10-03 ENCOUNTER — Telehealth (HOSPITAL_COMMUNITY): Payer: Self-pay | Admitting: Urgent Care

## 2020-10-03 MED ORDER — TRIAMCINOLONE ACETONIDE 0.1 % EX CREA: 1.0000 "application " | TOPICAL_CREAM | Freq: Two times a day (BID) | CUTANEOUS | 1 refills | Status: DC

## 2020-10-03 NOTE — Telephone Encounter (Signed)
Patient is having a recurrence of dyshidrotic eczema.  Needs refill of her triamcinolone.

## 2022-01-17 ENCOUNTER — Encounter: Payer: Self-pay | Admitting: Emergency Medicine

## 2022-01-17 ENCOUNTER — Ambulatory Visit
Admission: EM | Admit: 2022-01-17 | Discharge: 2022-01-17 | Disposition: A | Discharge status: Home / Self Care | Payer: BC Managed Care – PPO | Attending: Urgent Care | Admitting: Urgent Care

## 2022-01-17 ENCOUNTER — Ambulatory Visit (HOSPITAL_COMMUNITY): Payer: BC Managed Care – PPO

## 2022-01-17 ENCOUNTER — Ambulatory Visit (INDEPENDENT_AMBULATORY_CARE_PROVIDER_SITE_OTHER): Payer: BC Managed Care – PPO

## 2022-01-17 DIAGNOSIS — M25572 Pain in left ankle and joints of left foot: Secondary | ICD-10-CM | POA: Diagnosis not present

## 2022-01-17 DIAGNOSIS — S93402A Sprain of unspecified ligament of left ankle, initial encounter: Secondary | ICD-10-CM | POA: Diagnosis not present

## 2022-01-17 DIAGNOSIS — M79672 Pain in left foot: Secondary | ICD-10-CM | POA: Diagnosis not present

## 2022-01-17 MED ORDER — NAPROXEN 500 MG PO TABS: 500.0000 mg | ORAL_TABLET | Freq: Two times a day (BID) | ORAL | 0 refills | Status: AC

## 2022-01-17 NOTE — ED Triage Notes (Signed)
Pt here with left ankle pain after rolling it yesterday. Pt practiced RICE measures last night and has minimal swelling this morning, but still has pain when walking.

## 2022-01-17 NOTE — ED Provider Notes (Signed)
[facility]   MRN: [id number] DOB: October 04, 1990  Subjective:   Pamela Carpenter is a 30 y.o. female presenting for 1 day history of suffering a left ankle injury.  Rolled her ankle yesterday off of a step.  She had pain and swelling that has persisted into today.  The pain is generally mild.  No current facility-administered medications for this encounter.  Current Outpatient Medications:    acetaminophen (TYLENOL) 500 MG tablet, Take 2 tablets (1,000 mg total) by mouth every 6 (six) hours., Disp: 30 tablet, Rfl: 0   butalbital-acetaminophen-caffeine (FIORICET) 50-325-40 MG tablet, Take 1 tablet by mouth as needed for migraine. , Disp: , Rfl:    ibuprofen (ADVIL) 600 MG tablet, Take 1 tablet (600 mg total) by mouth every 6 (six) hours., Disp: 30 tablet, Rfl: 0   Prenatal Vit-Fe Fumarate-FA (PRENATAL MULTIVITAMIN) TABS tablet, Take 1 tablet by mouth daily at 12 noon., Disp: , Rfl:    triamcinolone (KENALOG) 0.1 %, Apply 1 application topically 2 (two) times daily., Disp: 30 g, Rfl: 1   Allergies  Allergen Reactions   Amoxicillin Hives    Past Medical History:  Diagnosis Date   Asthma    exercised induced   Breast mass    BV (bacterial vaginosis)    Kidney stones    Pregnant    Yeast infection      Past Surgical History:  Procedure Laterality Date   WISDOM TOOTH EXTRACTION      Family History  Problem Relation Age of Onset   Hypertension Father    Kidney disease Father    Diabetes Father    Diabetes Maternal Grandmother    Diabetes Maternal Grandfather    Heart disease Maternal Grandfather     Social History   Tobacco Use   Smoking status: Never   Smokeless tobacco: Never  Vaping Use   Vaping Use: Never used  Substance Use Topics   Alcohol use: Not Currently    Comment: Occasional   Drug use: No    ROS   Objective:   Vitals: BP 127/79   Pulse 67   Temp 97.6 F (36.4 C)   Resp 20   LMP 12/20/2021 (Approximate)   SpO2 98%    Breastfeeding No   Physical Exam Constitutional:      General: She is not in acute distress.    Appearance: Normal appearance. She is well-developed. She is not ill-appearing, toxic-appearing or diaphoretic.  HENT:     Head: Normocephalic and atraumatic.     Nose: Nose normal.     Mouth/Throat:     Mouth: Mucous membranes are moist.  Eyes:     General: No scleral icterus.       Right eye: No discharge.        Left eye: No discharge.     Extraocular Movements: Extraocular movements intact.  Cardiovascular:     Rate and Rhythm: Normal rate.  Pulmonary:     Effort: Pulmonary effort is normal.  Musculoskeletal:     Left ankle: No swelling, deformity, ecchymosis or lacerations. Tenderness present over the base of 5th metatarsal. No lateral malleolus, medial malleolus, ATF ligament, AITF ligament, CF ligament, posterior TF ligament or proximal fibula tenderness. Normal range of motion.     Left Achilles Tendon: No tenderness or defects. Thompson's test negative.  Skin:    General: Skin is warm and dry.  Neurological:     General: No focal deficit present.     Mental Status:  She is alert and oriented to person, place, and time.  Psychiatric:        Mood and Affect: Mood normal.        Behavior: Behavior normal.    DG Ankle Complete Left  Result Date: 01/17/2022 CLINICAL DATA:  Left ankle pain EXAM: LEFT ANKLE COMPLETE - 3+ VIEW COMPARISON:  None Available. FINDINGS: There is no evidence of fracture, dislocation, or joint effusion. There is no evidence of arthropathy or other focal bone abnormality. Soft tissues are unremarkable. IMPRESSION: Negative. Electronically Signed   By: Duanne Guess D.O.   On: 01/17/2022 09:39    Left ankle wrapped using 4" Ace wrap in figure-8 method.   Assessment and Plan :   PDMP not reviewed this encounter.  1. Sprain of left ankle, unspecified ligament, initial encounter   2. Acute left ankle pain   3. Left foot pain    Will manage for ankle  sprain with rice method, NSAID. Counseled patient on potential for adverse effects with medications prescribed/recommended today, ER and return-to-clinic precautions discussed, patient verbalized understanding.    Wallis Bamberg, [place] 01/17/22 [phone]

## 2022-02-27 ENCOUNTER — Telehealth: Payer: Self-pay | Admitting: Urgent Care

## 2022-02-27 MED ORDER — PREDNISONE 20 MG PO TABS: 40.0000 mg | ORAL_TABLET | Freq: Every day | ORAL | 0 refills | Status: DC

## 2022-02-27 MED ORDER — PSEUDOEPHEDRINE HCL 60 MG PO TABS: 60.0000 mg | ORAL_TABLET | Freq: Three times a day (TID) | ORAL | 0 refills | Status: DC | PRN

## 2022-02-27 MED ORDER — CETIRIZINE HCL 10 MG PO TABS: 10.0000 mg | ORAL_TABLET | Freq: Every day | ORAL | 0 refills | Status: DC

## 2022-02-27 MED ORDER — PROMETHAZINE-DM 6.25-15 MG/5ML PO SYRP: 2.5000 mL | ORAL_SOLUTION | Freq: Three times a day (TID) | ORAL | 0 refills | Status: DC | PRN

## 2022-02-27 NOTE — Telephone Encounter (Signed)
Patient needs refill of her allergy medications. Would like a cough medication as well. Will provide with a steroid course in the event she has allergic rhinitis flare.

## 2022-11-08 ENCOUNTER — Ambulatory Visit: Payer: BC Managed Care – PPO | Admitting: Physician Assistant

## 2022-11-14 NOTE — Progress Notes (Signed)
error 

## 2022-11-21 ENCOUNTER — Encounter: Payer: Self-pay | Admitting: Physician Assistant

## 2022-11-21 ENCOUNTER — Ambulatory Visit (INDEPENDENT_AMBULATORY_CARE_PROVIDER_SITE_OTHER): Payer: Managed Care, Other (non HMO) | Admitting: Physician Assistant

## 2022-11-21 VITALS — BP 126/80 | HR 80 | Temp 97.5°F | Ht 59.0 in | Wt 138.0 lb

## 2022-11-21 DIAGNOSIS — Z13220 Encounter for screening for lipoid disorders: Secondary | ICD-10-CM

## 2022-11-21 DIAGNOSIS — E663 Overweight: Secondary | ICD-10-CM | POA: Diagnosis not present

## 2022-11-21 DIAGNOSIS — Z136 Encounter for screening for cardiovascular disorders: Secondary | ICD-10-CM | POA: Diagnosis not present

## 2022-11-21 DIAGNOSIS — G44229 Chronic tension-type headache, not intractable: Secondary | ICD-10-CM | POA: Diagnosis not present

## 2022-11-21 DIAGNOSIS — Z8742 Personal history of other diseases of the female genital tract: Secondary | ICD-10-CM

## 2022-11-21 DIAGNOSIS — Z0000 Encounter for general adult medical examination without abnormal findings: Secondary | ICD-10-CM

## 2022-11-21 LAB — COMPREHENSIVE METABOLIC PANEL
ALT: 11 U/L (ref 0–35)
AST: 13 U/L (ref 0–37)
Albumin: 4.7 g/dL (ref 3.5–5.2)
Alkaline Phosphatase: 41 U/L (ref 39–117)
BUN: 9 mg/dL (ref 6–23)
CO2: 27 mEq/L (ref 19–32)
Calcium: 9.3 mg/dL (ref 8.4–10.5)
Chloride: 102 mEq/L (ref 96–112)
Creatinine, Ser: 0.65 mg/dL (ref 0.40–1.20)
GFR: 115.2 mL/min (ref >60.00)
Glucose, Bld: 95 mg/dL (ref 70–99)
Potassium: 3.6 mEq/L (ref 3.5–5.1)
Sodium: 137 mEq/L (ref 135–145)
Total Bilirubin: 0.7 mg/dL (ref 0.2–1.2)
Total Protein: 7 g/dL (ref 6.0–8.3)

## 2022-11-21 LAB — CBC WITH DIFFERENTIAL/PLATELET
Basophils Absolute: 0.1 10*3/uL (ref 0.0–0.1)
Basophils Relative: 0.7 % (ref 0.0–3.0)
Eosinophils Absolute: 0.1 10*3/uL (ref 0.0–0.7)
Eosinophils Relative: 1 % (ref 0.0–5.0)
HCT: 39.1 % (ref 36.0–46.0)
Hemoglobin: 13.1 g/dL (ref 12.0–15.0)
Lymphocytes Relative: 31.8 % (ref 12.0–46.0)
Lymphs Abs: 2.5 10*3/uL (ref 0.7–4.0)
MCHC: 33.5 g/dL (ref 30.0–36.0)
MCV: 92.4 fl (ref 78.0–100.0)
Monocytes Absolute: 0.6 10*3/uL (ref 0.1–1.0)
Monocytes Relative: 7.5 % (ref 3.0–12.0)
Neutro Abs: 4.7 10*3/uL (ref 1.4–7.7)
Neutrophils Relative %: 59 % (ref 43.0–77.0)
Platelets: 287 10*3/uL (ref 150.0–400.0)
RBC: 4.23 Mil/uL (ref 3.87–5.11)
RDW: 13.2 % (ref 11.5–15.5)
WBC: 7.9 10*3/uL (ref 4.0–10.5)

## 2022-11-21 LAB — LIPID PANEL
Cholesterol: 170 mg/dL (ref 0–200)
HDL: 60.2 mg/dL (ref >39.00)
LDL Cholesterol: 96 mg/dL (ref 0–99)
NonHDL: 109.73
Total CHOL/HDL Ratio: 3
Triglycerides: 67 mg/dL (ref 0.0–149.0)
VLDL: 13.4 mg/dL (ref 0.0–40.0)

## 2022-11-21 NOTE — Progress Notes (Signed)
Subjective:    Pamela Carpenter is a 32 y.o. female and is here for a comprehensive physical exam.   Health Maintenance Due  Topic Date Due   Hepatitis C Screening  Never done   PAP SMEAR-Modifier  Never done    Acute Concerns: None.  Chronic Issues: Headaches Long hx of migraines and tension HA. Took Fioricet 50-325-40 mg during pregnancies but has not needed since. Taking ibuprofen, Tylenol. Having frequent headaches with floaters. Migraines happen once a month usually.  Believes they are caffeine induced. Drinks one caffeine beverage every day and works on the night shift. Had bilateral eye surgery in 2023.  Ovarian Cyst Has had an ovarian cyst for some time and is experiencing some mild abdominal pain currently. This discomfort is not unusual for her. Denies any worsening or significant change. Denies ruptured cyst, PCOS, difficulty becoming pregnant.  Had kidney stones in 2009 during first pregnancy. Denies hyperglycemia, hypertension during pregnancies. Denies heavy menses. Denies hemiparesis, leg swelling, GI symptoms.  Health Maintenance: Immunizations -- UTD on flu, Covid-19, tetanus vaccines. Colonoscopy -- N/A Mammogram -- N/A PAP -- Followed by [facility]. UTD - requesting records today Bone Density -- N/A Diet -- Good. Exercise -- Stays active by walking as part of her job.  Sleep habits -- Usually good. Has infrequent insomnia. Mood -- Stable.  UTD with dentist? - UTD UTD with eye doctor? - UTD  Weight history: Wt Readings from Last 10 Encounters:  11/21/22 138 lb (62.6 kg)  02/09/20 141 lb (64 kg)  08/25/19 126 lb (57.2 kg)  03/25/18 118 lb (53.5 kg)  10/09/17 115 lb 3.2 oz (52.3 kg)  08/22/12 112 lb (50.8 kg)   Body mass index is 27.87 kg/m. Patient's last menstrual period was 11/10/2022 (exact date).  Alcohol use:  reports current alcohol use of about 3.0 standard drinks of alcohol per week.  Tobacco use:  Tobacco  Use: Low Risk  (11/21/2022)   Patient History    Smoking Tobacco Use: Never    Smokeless Tobacco Use: Never    Passive Exposure: Not on file   Eligible for lung cancer screening? No     11/21/2022    8:40 AM  Depression screen PHQ 2/9  Decreased Interest 0  Down, Depressed, Hopeless 0  PHQ - 2 Score 0     Other providers/specialists: Patient Care Team: Inda Coke, [place] as PCP - General (Physician Assistant)    PMHx, SurgHx, SocialHx, Medications, and Allergies were reviewed in the Visit Navigator and updated as appropriate.   Past Medical History:  Diagnosis Date   Asthma    exercised induced   BV (bacterial vaginosis)    Kidney stones    Migraines    Vaginal delivery    2010, 2021   Yeast infection      Past Surgical History:  Procedure Laterality Date   EYE SURGERY Bilateral    02/2022, 03/2022   WISDOM TOOTH EXTRACTION       Family History  Problem Relation Age of Onset   Hypertension Father    Kidney disease Father    Diabetes Father    Asthma Maternal Grandmother    Diabetes Maternal Grandmother    Kidney disease Maternal Grandfather    Diabetes Maternal Grandfather    Heart disease Maternal Grandfather    Throat cancer Maternal Grandfather    Heart attack Maternal Grandfather     Social History   Tobacco Use   Smoking status: Never   Smokeless tobacco:  Never  Vaping Use   Vaping Use: Never used  Substance Use Topics   Alcohol use: Yes    Alcohol/week: 3.0 standard drinks of alcohol    Types: 3 Glasses of wine per week    Comment: Occasional   Drug use: No    Review of Systems:   Review of Systems  Constitutional:  Negative for chills, fever, malaise/fatigue and weight loss.  HENT:  Negative for hearing loss, sinus pain and sore throat.   Respiratory:  Negative for cough, hemoptysis and shortness of breath.   Cardiovascular:  Negative for chest pain, palpitations, leg swelling and PND.  Gastrointestinal:  Positive for abdominal  pain. Negative for blood in stool, constipation, diarrhea, heartburn, nausea and vomiting.  Genitourinary:  Negative for dysuria, frequency and urgency.  Musculoskeletal:  Negative for back pain, myalgias and neck pain.  Skin:  Negative for itching and rash.  Neurological:  Positive for headaches. Negative for dizziness, tingling and seizures.  Endo/Heme/Allergies:  Negative for polydipsia.  Psychiatric/Behavioral:  Negative for depression. The patient is not nervous/anxious.     Objective:   BP 126/80 (BP Location: Left Arm, Patient Position: Sitting, Cuff Size: Normal)   Pulse 80   Temp (!) 97.5 F (36.4 C) (Temporal)   Ht 4\' 11"  (1.499 m)   Wt 138 lb (62.6 kg)   LMP 11/10/2022 (Exact Date)   SpO2 98%   BMI 27.87 kg/m  Body mass index is 27.87 kg/m.   General Appearance:    Alert, cooperative, no distress, appears stated age  Head:    Normocephalic, without obvious abnormality, atraumatic  Eyes:    PERRL, conjunctiva/corneas clear, EOM's intact, fundi    benign, both eyes  Ears:    Normal TM's and external ear canals, both ears  Nose:   Nares normal, septum midline, mucosa normal, no drainage    or sinus tenderness  Throat:   Lips, mucosa, and tongue normal; teeth and gums normal  Neck:   Supple, symmetrical, trachea midline, no adenopathy;    thyroid:  no enlargement/tenderness/nodules; no carotid   bruit or JVD  Back:     Symmetric, no curvature, ROM normal, no CVA tenderness  Lungs:     Clear to auscultation bilaterally, respirations unlabored  Chest Wall:    No tenderness or deformity   Heart:    Regular rate and rhythm, S1 and S2 normal, no murmur, rub or gallop  Breast Exam:    Deferred   Abdomen:     Soft, non-tender, bowel sounds active all four quadrants,    no masses, no organomegaly  Genitalia:    Deferred   Extremities:   Extremities normal, atraumatic, no cyanosis or edema  Pulses:   2+ and symmetric all extremities  Skin:   Skin color, texture, turgor  normal, no rashes or lesions  Lymph nodes:   Cervical, supraclavicular, and axillary nodes normal  Neurologic:   CNII-XII intact, normal strength, sensation and reflexes    throughout    Assessment/Plan:   Routine physical examination Today patient counseled on age appropriate routine health concerns for screening and prevention, each reviewed and up to date or declined. Immunizations reviewed and up to date or declined. Labs ordered and reviewed. Risk factors for depression reviewed and negative. Hearing function and visual acuity are intact. ADLs screened and addressed as needed. Functional ability and level of safety reviewed and appropriate. Education, counseling and referrals performed based on assessed risks today. Patient provided with a copy of personalized plan  for preventive services.  Overweight Continue efforts at healthy diet and exercise  Chronic tension-type headache, not intractable Ongoing Denies new/significant concerns Recommend consideration of magnesium, riboflavin, coq10 - discussed on AVS Continue healthy eating/sleep/stress management Follow-up if lack of improvement or new sx No red flags  Encounter for lipid screening for cardiovascular disease Update lipid panel  History of ovarian cyst No red flags or concerning symptoms  I,Alexander Ruley,acting as a scribe for [employer], PA.,have documented all relevant documentation on the behalf of Inda Coke, PA,as directed by  Inda Coke, PA while in the presence of Inda Coke, [place].   I, Inda Coke, [place], have reviewed all documentation for this visit. The documentation on 11/21/22 for the exam, diagnosis, procedures, and orders are all accurate and complete.   Inda Coke, PA-C [place]

## 2022-11-21 NOTE — Patient Instructions (Signed)
It was great to see you!  Headache recommendations 3.  Limit use of pain relievers to no more than 2 days out of the week.  These medications include acetaminophen, ibuprofen, triptans and narcotics.  This will help reduce risk of rebound headaches. 4.  Be aware of common food triggers such as processed sweets, processed foods with nitrites (such as deli meat, hot dogs, sausages), foods with MSG, alcohol (such as wine), chocolate, certain cheeses, certain fruits (dried fruits, bananas, pineapple), vinegar, diet soda. 4.  Avoid caffeine 5.  Routine exercise 6.  Proper sleep hygiene 7.  Stay adequately hydrated with water 8.  Keep a headache diary. 9.  Maintain proper stress management. 10.  Do not skip meals. 11.  Consider supplements:  Magnesium citrate 400mg  to 600mg  daily, riboflavin 400mg , Coenzyme Q 10 100mg  three times daily  Headache Precautions: Contact a doctor if: Your symptoms are not helped by medicine. You have a headache that feels different than the other headaches. You feel sick to your stomach (nauseous) or you throw up (vomit). You have a fever. Get help right away if: Your headache gets very bad quickly. Your headache gets worse after a lot of physical activity. You keep throwing up. You have a stiff neck. You have trouble seeing. You have trouble speaking. You have pain in the eye or ear. Your muscles are weak or you lose muscle control. You lose your balance or have trouble walking. You feel like you will pass out (faint) or you pass out. You are mixed up (confused). You have a seizure.

## 2022-12-21 ENCOUNTER — Encounter: Payer: Self-pay | Admitting: Physician Assistant

## 2023-02-24 ENCOUNTER — Ambulatory Visit
Admission: EM | Admit: 2023-02-24 | Discharge: 2023-02-24 | Disposition: A | Discharge status: Home / Self Care | Payer: Managed Care, Other (non HMO) | Attending: Internal Medicine | Admitting: Internal Medicine

## 2023-02-24 DIAGNOSIS — L249 Irritant contact dermatitis, unspecified cause: Secondary | ICD-10-CM

## 2023-02-24 DIAGNOSIS — L03115 Cellulitis of right lower limb: Secondary | ICD-10-CM | POA: Diagnosis not present

## 2023-02-24 MED ORDER — HYDROXYZINE HCL 25 MG PO TABS: 12.5000 mg | ORAL_TABLET | Freq: Three times a day (TID) | ORAL | 0 refills | Status: DC | PRN

## 2023-02-24 MED ORDER — BETAMETHASONE DIPROPIONATE 0.05 % EX OINT: TOPICAL_OINTMENT | Freq: Two times a day (BID) | CUTANEOUS | 0 refills | Status: AC

## 2023-02-24 MED ORDER — DOXYCYCLINE HYCLATE 100 MG PO CAPS: 100.0000 mg | ORAL_CAPSULE | Freq: Two times a day (BID) | ORAL | 0 refills | Status: DC

## 2023-02-24 NOTE — ED Notes (Signed)
Triage by Urban Gibson, PA-C

## 2023-02-24 NOTE — ED Provider Notes (Signed)
[facility]  Note:  This document was prepared using Conservation officer, historic buildings and may include unintentional dictation errors.  MRN: [id number] DOB: 12/19/1990  Subjective:   Pamela Carpenter is a 32 y.o. female presenting for 4 day history of acute onset persistent and worsening tenderness, warmth and irritation of the right lower leg.  This was from an insect bite she suffered.  Initially was very itchy and patient admits that she was scratching excessively, caused an open wound.  She has other bites that she suffered along her thighs but no note is bothersome as this on the right lower posterior leg.  No fever, drainage of pus or bleeding.  No current facility-administered medications for this encounter.  Current Outpatient Medications:    acetaminophen (TYLENOL) 500 MG tablet, Take 2 tablets (1,000 mg total) by mouth every 6 (six) hours., Disp: 30 tablet, Rfl: 0   Cholecalciferol (VITAMIN D-3) 25 MCG (1000 UT) CAPS, Take 1 capsule by mouth daily in the afternoon., Disp: , Rfl:    Cranberry-Vitamin C (CRANBERRY CONCENTRATE/VITAMINC PO), Take 1 capsule by mouth 2 (two) times daily. Cranberry 30,000 mg and Vit C 200 mg, Disp: , Rfl:    Ferrous Sulfate (IRON) 325 (65 Fe) MG TABS, Take 1 tablet by mouth daily in the afternoon., Disp: , Rfl:    ibuprofen (ADVIL) 600 MG tablet, Take 1 tablet (600 mg total) by mouth every 6 (six) hours., Disp: 30 tablet, Rfl: 0   Magnesium 500 MG TABS, Take 1 tablet by mouth daily in the afternoon., Disp: , Rfl:    Multiple Vitamin (MULTIVITAMIN) tablet, Take 1 tablet by mouth daily., Disp: , Rfl:    naproxen (NAPROSYN) 500 MG tablet, Take 1 tablet (500 mg total) by mouth 2 (two) times daily with a meal., Disp: 30 tablet, Rfl: 0   Allergies  Allergen Reactions   Amoxicillin Hives    Past Medical History:  Diagnosis Date   Asthma    exercised induced   BV (bacterial vaginosis)    Kidney stones    Migraines    Vaginal  delivery    2010, 2021   Yeast infection      Past Surgical History:  Procedure Laterality Date   EYE SURGERY Bilateral    02/2022, 03/2022   WISDOM TOOTH EXTRACTION      Family History  Problem Relation Age of Onset   Hypertension Father    Kidney disease Father    Diabetes Father    Asthma Maternal Grandmother    Diabetes Maternal Grandmother    Kidney disease Maternal Grandfather    Diabetes Maternal Grandfather    Heart disease Maternal Grandfather    Throat cancer Maternal Grandfather    Heart attack Maternal Grandfather     Social History   Tobacco Use   Smoking status: Never   Smokeless tobacco: Never  Vaping Use   Vaping Use: Never used  Substance Use Topics   Alcohol use: Yes    Alcohol/week: 3.0 standard drinks of alcohol    Types: 3 Glasses of wine per week    Comment: Occasional   Drug use: No    ROS   Objective:   Vitals: BP (!) 158/92 (BP Location: Left Arm)   Pulse 80   Temp 99.2 F (37.3 C) (Oral)   Resp 18   SpO2 98%   BP Readings from Last 3 Encounters:  11/21/22 126/80  01/17/22 127/79  04/11/20 117/74   Physical Exam Constitutional:  General: She is not in acute distress.    Appearance: Normal appearance. She is well-developed. She is not ill-appearing, toxic-appearing or diaphoretic.  HENT:     Head: Normocephalic and atraumatic.     Nose: Nose normal.     Mouth/Throat:     Mouth: Mucous membranes are moist.  Eyes:     General: No scleral icterus.       Right eye: No discharge.        Left eye: No discharge.     Extraocular Movements: Extraocular movements intact.  Cardiovascular:     Rate and Rhythm: Normal rate.  Pulmonary:     Effort: Pulmonary effort is normal.  Skin:    General: Skin is warm and dry.          Comments: Scantly scattered resolving bite wounds all less than ~1cm circumferentially over the thighs and lower legs.  Neurological:     General: No focal deficit present.     Mental Status: She is  alert and oriented to person, place, and time.  Psychiatric:        Mood and Affect: Mood normal.        Behavior: Behavior normal.     Assessment and Plan :   PDMP not reviewed this encounter.  1. Cellulitis of right lower leg   2. Irritant contact dermatitis, unspecified trigger    Will manage secondary cellulitis with doxycycline, primary irritant dermatitis with betamethasone ointment.  Hydroxyzine for itching.  Counseled patient on potential for adverse effects with medications prescribed/recommended today, ER and return-to-clinic precautions discussed, patient verbalized understanding.    Wallis Bamberg, PA-C 02/24/23 1344

## 2023-02-25 ENCOUNTER — Telehealth: Payer: Self-pay | Admitting: Urgent Care

## 2023-02-25 MED ORDER — CLINDAMYCIN HCL 300 MG PO CAPS: 300.0000 mg | ORAL_CAPSULE | Freq: Three times a day (TID) | ORAL | 0 refills | Status: DC

## 2023-02-25 NOTE — Telephone Encounter (Signed)
Patient called and unfortunately, she is having a difficult time tolerating doxycycline due to GI side effects. Also is experiencing significant drowsiness which I suspect is from hydroxyzine. Will have her d/c those medications. Prescription sent for clindamycin to avoid any possible allergic reaction with cephalosporins or sulfa antibiotics.

## 2024-04-01 ENCOUNTER — Encounter: Admitting: Physician Assistant

## 2024-05-15 ENCOUNTER — Encounter: Admitting: Physician Assistant

## 2024-06-02 ENCOUNTER — Ambulatory Visit: Payer: Self-pay | Admitting: Physician Assistant

## 2024-06-02 ENCOUNTER — Ambulatory Visit (INDEPENDENT_AMBULATORY_CARE_PROVIDER_SITE_OTHER): Admitting: Physician Assistant

## 2024-06-02 VITALS — BP 128/82 | HR 84 | Ht 59.0 in | Wt 134.0 lb

## 2024-06-02 DIAGNOSIS — Z0000 Encounter for general adult medical examination without abnormal findings: Secondary | ICD-10-CM

## 2024-06-02 DIAGNOSIS — G44229 Chronic tension-type headache, not intractable: Secondary | ICD-10-CM

## 2024-06-02 LAB — LIPID PANEL
Cholesterol: 183 mg/dL (ref 0–200)
HDL: 60.3 mg/dL (ref >39.00)
LDL Cholesterol: 110 mg/dL — ABNORMAL HIGH (ref 0–99)
NonHDL: 122.69
Total CHOL/HDL Ratio: 3
Triglycerides: 63 mg/dL (ref 0.0–149.0)
VLDL: 12.6 mg/dL (ref 0.0–40.0)

## 2024-06-02 LAB — COMPREHENSIVE METABOLIC PANEL WITH GFR
ALT: 11 U/L (ref 0–35)
AST: 14 U/L (ref 0–37)
Albumin: 4.4 g/dL (ref 3.5–5.2)
Alkaline Phosphatase: 29 U/L — ABNORMAL LOW (ref 39–117)
BUN: 14 mg/dL (ref 6–23)
CO2: 26 meq/L (ref 19–32)
Calcium: 9.1 mg/dL (ref 8.4–10.5)
Chloride: 105 meq/L (ref 96–112)
Creatinine, Ser: 0.7 mg/dL (ref 0.40–1.20)
GFR: 111.95 mL/min (ref >60.00)
Glucose, Bld: 98 mg/dL (ref 70–99)
Potassium: 3.9 meq/L (ref 3.5–5.1)
Sodium: 138 meq/L (ref 135–145)
Total Bilirubin: 0.4 mg/dL (ref 0.2–1.2)
Total Protein: 6.8 g/dL (ref 6.0–8.3)

## 2024-06-02 LAB — CBC WITH DIFFERENTIAL/PLATELET
Basophils Absolute: 0.1 K/uL (ref 0.0–0.1)
Basophils Relative: 0.9 % (ref 0.0–3.0)
Eosinophils Absolute: 0.1 K/uL (ref 0.0–0.7)
Eosinophils Relative: 2 % (ref 0.0–5.0)
HCT: 41.3 % (ref 36.0–46.0)
Hemoglobin: 13.5 g/dL (ref 12.0–15.0)
Lymphocytes Relative: 35.7 % (ref 12.0–46.0)
Lymphs Abs: 2.2 K/uL (ref 0.7–4.0)
MCHC: 32.7 g/dL (ref 30.0–36.0)
MCV: 92 fl (ref 78.0–100.0)
Monocytes Absolute: 0.5 K/uL (ref 0.1–1.0)
Monocytes Relative: 7.7 % (ref 3.0–12.0)
Neutro Abs: 3.3 K/uL (ref 1.4–7.7)
Neutrophils Relative %: 53.7 % (ref 43.0–77.0)
Platelets: 254 K/uL (ref 150.0–400.0)
RBC: 4.49 Mil/uL (ref 3.87–5.11)
RDW: 12.8 % (ref 11.5–15.5)
WBC: 6.1 K/uL (ref 4.0–10.5)

## 2024-06-02 NOTE — Progress Notes (Signed)
 Subjective:    Pamela Carpenter is a 33 y.o. female and is here for a comprehensive physical exam.  HPI  Health Maintenance Due  Topic Date Due   Hepatitis C Screening  Never done   Hepatitis B Vaccines 19-59 Average Risk (1 of 3 - 19+ 3-dose series) Never done   HPV VACCINES (1 - 3-dose SCDM series) Never done     Discussed the use of AI scribe software for clinical note transcription with the patient, who gave verbal consent to proceed.  History of Present Illness   Pamela Carpenter is a 32 year old female who presents for an annual physical exam.  She experiences frequent headaches and currently takes magnesium, which provides some relief. Previously, she tried a combination of magnesium, CoQ10, and riboflavin, which helped slightly, but she now only takes magnesium and fish oil.  She has no sleep schedule due to working nights, which affects her sleep quality. She is currently on a birth control pill and has no concerns with her medications.  No chest pain, unusual numbness, tingling, tremor, pain, or swelling in her legs. No gastrointestinal concerns, including constipation, diarrhea, or rectal bleeding.  She received her flu shot at work.      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 - yes UTD with eye doctor? - yes  Weight history: Wt Readings from Last 10 Encounters:  06/02/24 134 lb (60.8 kg)  11/21/22 138 lb (62.6 kg)  02/09/20 141 lb (64 kg)  08/25/19 126 lb (57.2 kg)  03/25/18 118 lb (53.5 kg)  10/09/17 115 lb 3.2 oz (52.3 kg)  08/22/12 112 lb (50.8 kg)   Body mass index is 27.06 kg/m. No LMP recorded.  Alcohol use:  reports current alcohol use of about 3.0 standard drinks of alcohol per  week.  Tobacco use:  Tobacco Use: Low Risk  (06/02/2024)   Patient History    Smoking Tobacco Use: Never    Smokeless Tobacco Use: Never    Passive Exposure: Not on file   Eligible for lung cancer screening? no     06/02/2024    1:07 PM  Depression screen PHQ 2/9  Decreased Interest 0  Down, Depressed, Hopeless 0  PHQ - 2 Score 0  Altered sleeping 0  Tired, decreased energy 0  Change in appetite 0  Feeling bad or failure about yourself  0  Trouble concentrating 0  Moving slowly or fidgety/restless 0  Suicidal thoughts 0  PHQ-9 Score 0  Difficult doing work/chores Not difficult at all     Other providers/specialists: Patient Care Team: Job Lukes, [place] as PCP - General (Physician Assistant)    PMHx, SurgHx, SocialHx, Medications, and Allergies were reviewed in the Visit Navigator and updated as appropriate.   Past Medical History:  Diagnosis Date   Allergy    Asthma    exercised induced   BV (bacterial vaginosis)    Kidney stones    Migraines    Vaginal delivery    2010, 2021   Yeast infection      Past Surgical History:  Procedure Laterality Date   EYE SURGERY Bilateral    02/2022, 03/2022   WISDOM TOOTH EXTRACTION       Family History  Problem  Relation Age of Onset   Hypertension Father    Kidney disease Father        kidney transplant   Diabetes Father    Asthma Maternal Grandmother    Diabetes Maternal Grandmother    Kidney disease Maternal Grandfather    Diabetes Maternal Grandfather    Heart disease Maternal Grandfather    Throat cancer Maternal Grandfather    Heart attack Maternal Grandfather     Social History   Tobacco Use   Smoking status: Never   Smokeless tobacco: Never  Vaping Use   Vaping status: Never Used  Substance Use Topics   Alcohol use: Yes    Alcohol/week: 3.0 standard drinks of alcohol    Types: 3 Glasses of wine per week    Comment: Occasional   Drug use: No    Review of Systems:   Review of Systems   Constitutional:  Negative for chills, fever, malaise/fatigue and weight loss.  HENT:  Negative for hearing loss, sinus pain and sore throat.   Respiratory:  Negative for cough and hemoptysis.   Cardiovascular:  Negative for chest pain, palpitations, leg swelling and PND.  Gastrointestinal:  Negative for abdominal pain, constipation, diarrhea, heartburn, nausea and vomiting.  Genitourinary:  Negative for dysuria, frequency and urgency.  Musculoskeletal:  Negative for back pain, myalgias and neck pain.  Skin:  Negative for itching and rash.  Neurological:  Negative for dizziness, tingling, seizures and headaches.  Endo/Heme/Allergies:  Negative for polydipsia.  Psychiatric/Behavioral:  Negative for depression. The patient is not nervous/anxious.     Objective:   BP 128/82   Pulse 84   Ht 4' 11 (1.499 m)   Wt 134 lb (60.8 kg)   SpO2 98%   BMI 27.06 kg/m  Body mass index is 27.06 kg/m.   General Appearance:    Alert, cooperative, no distress, appears stated age  Head:    Normocephalic, without obvious abnormality, atraumatic  Eyes:    PERRL, conjunctiva/corneas clear, EOM's intact, fundi    benign, both eyes  Ears:    Normal TM's and external ear canals, both ears  Nose:   Nares normal, septum midline, mucosa normal, no drainage    or sinus tenderness  Throat:   Lips, mucosa, and tongue normal; teeth and gums normal  Neck:   Supple, symmetrical, trachea midline, no adenopathy;    thyroid :  no enlargement/tenderness/nodules; no carotid   bruit or JVD  Back:     Symmetric, no curvature, ROM normal, no CVA tenderness  Lungs:     Clear to auscultation bilaterally, respirations unlabored  Chest Wall:    No tenderness or deformity   Heart:    Regular rate and rhythm, S1 and S2 normal, no murmur, rub or gallop  Breast Exam:    Deferred   Abdomen:     Soft, non-tender, bowel sounds active all four quadrants,    no masses, no organomegaly  Genitalia:    Deferred   Extremities:    Extremities normal, atraumatic, no cyanosis or edema  Pulses:   2+ and symmetric all extremities  Skin:   Skin color, texture, turgor normal, no rashes or lesions  Lymph nodes:   Cervical, supraclavicular, and axillary nodes normal  Neurologic:   CNII-XII intact, normal strength, sensation and reflexes    throughout    Assessment/Plan:   Assessment and Plan    Adult Wellness Visit Routine wellness visit. No new health concerns. Family history of kidney disease and cancer noted. Vaccination status  updated. - Order CBC, CMP, and lipid panel.  Chronic tension headache(s)  Chronic headaches with partial relief from magnesium. Previous trial of magnesium, CoQ10, and riboflavin showed some improvement. - Continue magnesium supplementation. - reports there is no significant changes or concerns to her symptom(s) from last year           Lucie Buttner, PA-C [facility]

## 2024-06-16 ENCOUNTER — Encounter: Admitting: Physician Assistant

## 2024-08-23 ENCOUNTER — Telehealth: Payer: Self-pay | Admitting: Urgent Care

## 2024-08-23 MED ORDER — OSELTAMIVIR PHOSPHATE 75 MG PO CAPS: 75.0000 mg | ORAL_CAPSULE | Freq: Two times a day (BID) | ORAL | 0 refills | Status: AC

## 2024-08-23 NOTE — Telephone Encounter (Signed)
 Patient tested positive for Flu B with a home test. Needs Tamiflu .

## 2024-09-09 ENCOUNTER — Encounter: Payer: Self-pay | Admitting: Physician Assistant

## 2024-09-09 DIAGNOSIS — Z111 Encounter for screening for respiratory tuberculosis: Secondary | ICD-10-CM

## 2024-09-10 ENCOUNTER — Other Ambulatory Visit: Payer: Self-pay | Admitting: Physician Assistant

## 2024-09-12 LAB — QUANTIFERON-TB GOLD PLUS
QuantiFERON Mitogen Value: >10 [IU]/mL
QuantiFERON Nil Value: 0.01 [IU]/mL
QuantiFERON TB1 Ag Value: 0.02 [IU]/mL
QuantiFERON TB2 Ag Value: 0.04 [IU]/mL
QuantiFERON-TB Gold Plus: NEGATIVE

## 2024-09-14 ENCOUNTER — Ambulatory Visit: Payer: Self-pay | Admitting: Physician Assistant

## 2025-06-04 ENCOUNTER — Encounter: Admitting: Physician Assistant
